# Patient Record
Sex: Male | Born: 1960
Health system: Southern US, Community
[De-identification: ages and names within clinical notes are randomized; demographics above are authoritative.]

## PROBLEM LIST (undated history)

## (undated) DIAGNOSIS — K219 Gastro-esophageal reflux disease without esophagitis: Secondary | ICD-10-CM

## (undated) DIAGNOSIS — I10 Essential (primary) hypertension: Secondary | ICD-10-CM

## (undated) DIAGNOSIS — S21119A Laceration without foreign body of unspecified front wall of thorax without penetration into thoracic cavity, initial encounter: Secondary | ICD-10-CM

## (undated) HISTORY — PX: CARDIAC SURGERY: SHX584

---

## 1998-09-14 ENCOUNTER — Emergency Department (HOSPITAL_COMMUNITY): Admission: EM | Admit: 1998-09-14 | Discharge: 1998-09-14 | Payer: Self-pay | Admitting: Emergency Medicine

## 2001-12-20 ENCOUNTER — Encounter: Payer: Self-pay | Admitting: Emergency Medicine

## 2001-12-20 ENCOUNTER — Emergency Department (HOSPITAL_COMMUNITY): Admission: EM | Admit: 2001-12-20 | Discharge: 2001-12-20 | Payer: Self-pay | Admitting: Emergency Medicine

## 2006-06-28 ENCOUNTER — Emergency Department (HOSPITAL_COMMUNITY): Admission: EM | Admit: 2006-06-28 | Discharge: 2006-06-28 | Payer: Self-pay | Admitting: Emergency Medicine

## 2006-08-04 ENCOUNTER — Ambulatory Visit (HOSPITAL_COMMUNITY): Admission: RE | Admit: 2006-08-04 | Discharge: 2006-08-04 | Payer: Self-pay | Admitting: General Surgery

## 2007-08-01 ENCOUNTER — Emergency Department (HOSPITAL_COMMUNITY): Admission: EM | Admit: 2007-08-01 | Discharge: 2007-08-01 | Payer: Self-pay | Admitting: Family Medicine

## 2008-03-20 ENCOUNTER — Emergency Department (HOSPITAL_COMMUNITY): Admission: EM | Admit: 2008-03-20 | Discharge: 2008-03-20 | Payer: Self-pay | Admitting: Emergency Medicine

## 2008-03-29 ENCOUNTER — Emergency Department (HOSPITAL_COMMUNITY): Admission: EM | Admit: 2008-03-29 | Discharge: 2008-03-29 | Payer: Self-pay | Admitting: Emergency Medicine

## 2010-12-07 NOTE — Op Note (Signed)
Larry Green, Larry Green              ACCOUNT NO.:  [id number]   MEDICAL RECORD NO.:  [id number]          PATIENT TYPE:  AMB   LOCATION:  SDS                          FACILITY:  [facility]   PHYSICIAN:  Gabrielle Dare. Janee Morn, M.D.DATE OF BIRTH:  06-Feb-1961   DATE OF PROCEDURE:  08/04/2006  DATE OF DISCHARGE:  08/04/2006                               OPERATIVE REPORT   PREOPERATIVE DIAGNOSIS:  Left inguinal hernia.   POSTOPERATIVE DIAGNOSIS:  Left inguinal hernia.   PROCEDURE:  Repair of left inguinal hernia with mesh.   SURGEON:  Gabrielle Dare. Janee Morn, M.D.   ANESTHESIA:  General.   HISTORY OF PRESENT ILLNESS:  Larry Green is a 50 year old African  American male whom I evaluated in the office for a symptomatic left  inguinal hernia who presents today for elective repair.   PROCEDURE IN DETAIL:  The patient was identified in the preop holding  area and an informed consent was obtained.  His site was marked.  He  received intravenous antibiotics.  He was brought to the operating room;  and general anesthesia with laryngeal mask airway was administered by  the anesthesia staff.  His abdomen and left groin were prepped and  draped in a sterile fashion.  Then 1/4% Marcaine with epinephrine was  injected for local anesthetic along the planned line of incision; and  out towards the anterior-superior iliac spine for some nerve block.  Left inguinal incision was made.  Subcutaneous tissues were dissected  down through Scarpa fascia excellent hemostasis was obtained with Bovie  cautery.  External oblique fascia was exposed.  This was divided  laterally and down through the external ring.  The superior leaflet of  the external oblique was dissected free off the transversalis; and the  inferior leaflet was dissected free and down away from cord structures,  revealing the shelving edge of inguinal ligament.  Ilioinguinal nerve  was protected; and swept out of the way.  Cord structures were encircled  with a Penrose drain.  Exploration of the floor revealed a direct  inguinal hernia which easily reduced; and the sac was not violated.  The  cord was then dissected.  There was a tiny cord lipoma, but no evidence  of an indirect hernia sac; this was clarified with careful dissection.   Once this was accomplished, the direct hernia sac was reduced; and the  hernia repair was accomplished with the keyhole mesh of polypropylene.  This was secured to the tissues over the pubic tubercle medially with 2-  0 Prolene suture.  It was then secured in a running fashion, inferiorly,  along the shelving edge of the inguinal ligament extending out  laterally.  The superior portion of mesh was then secured to the tissues  over the pubic tubercle with 2-0 Prolene suture; and it was tacked down  in an interrupted fashion with 2-0 Prolene sutures to the transversalis  musculature; and extending out laterally.  Two leaflets of the keyhole  mesh were rejoined behind the cord structures, reconstructing this ring.  These were tacked together and down to the underlying musculature with  several interrupted 2-0  Prolene sutures.  The area was copiously  irrigated.  Meticulous hemostasis was ensured.  The cord structures were  checked.  The aperture in the mesh was opened up, just slightly, to  allow the tip of the fifth digit to be admitted next to the cord  structures. The cord structures remained viable and non edematous.  Some  additional local anesthetic was injected.  Hemostasis was, again,  ensured and the external oblique fascia was then closed with a running 2-  0 Vicryl suture.  Subcutaneous tissues were irrigated, again.  Scarpa  fascia was approximated with interrupted 3-0 Vicryl sutures and the skin  was closed with a running 4-0 Monocryl subcuticular stitch.  Sponge,  needle, and instrument counts were correct.  Benzoin, Steri-Strips, and  sterile dressings were applied.  Please note that prior to  completion of  the fascial closure, some additional local anesthetic was injected.  Benzoin, Steri-Strips, and sterile dressings were applied.  The patient  tolerated the procedure well without apparent complication; and was  taken to the recovery room in stable condition.  Please note, that prior  to leaving the operating room his left testicle was relocated into  anatomic position in the scrotum.      Gabrielle Dare Janee Morn, M.D.  Electronically Signed     BET/MEDQ  D:  08/04/2006  T:  08/04/2006  Job:  [id number]

## 2011-01-13 ENCOUNTER — Emergency Department (HOSPITAL_COMMUNITY)
Admission: EM | Admit: 2011-01-13 | Discharge: 2011-01-13 | Disposition: A | Discharge status: Home / Self Care | Payer: Self-pay | Attending: Emergency Medicine | Admitting: Emergency Medicine

## 2011-01-13 DIAGNOSIS — M545 Low back pain, unspecified: Secondary | ICD-10-CM | POA: Insufficient documentation

## 2011-04-11 LAB — POCT RAPID STREP A: Streptococcus, Group A Screen (Direct): NEGATIVE

## 2011-06-10 ENCOUNTER — Emergency Department (HOSPITAL_COMMUNITY): Payer: Self-pay

## 2011-06-10 ENCOUNTER — Other Ambulatory Visit: Payer: Self-pay

## 2011-06-10 ENCOUNTER — Emergency Department (HOSPITAL_COMMUNITY)
Admission: EM | Admit: 2011-06-10 | Discharge: 2011-06-10 | Disposition: A | Discharge status: Home / Self Care | Payer: Self-pay | Attending: Emergency Medicine | Admitting: Emergency Medicine

## 2011-06-10 ENCOUNTER — Encounter: Payer: Self-pay | Admitting: *Deleted

## 2011-06-10 DIAGNOSIS — R209 Unspecified disturbances of skin sensation: Secondary | ICD-10-CM | POA: Insufficient documentation

## 2011-06-10 DIAGNOSIS — R200 Anesthesia of skin: Secondary | ICD-10-CM

## 2011-06-10 DIAGNOSIS — Z7982 Long term (current) use of aspirin: Secondary | ICD-10-CM | POA: Insufficient documentation

## 2011-06-10 LAB — CBC
Hemoglobin: 15.9 g/dL (ref 13.0–17.0)
RBC: 4.95 MIL/uL (ref 4.22–5.81)

## 2011-06-10 LAB — RAPID URINE DRUG SCREEN, HOSP PERFORMED
Amphetamines: NOT DETECTED
Barbiturates: NOT DETECTED
Benzodiazepines: NOT DETECTED
Cocaine: NOT DETECTED
Tetrahydrocannabinol: POSITIVE — AB

## 2011-06-10 LAB — POCT I-STAT, CHEM 8
BUN: 13 mg/dL (ref 6–23)
Calcium, Ion: 1.15 mmol/L (ref 1.12–1.32)
TCO2: 26 mmol/L (ref 0–100)

## 2011-06-10 LAB — DIFFERENTIAL
Lymphocytes Relative: 37 % (ref 12–46)
Lymphs Abs: 2.3 10*3/uL (ref 0.7–4.0)
Monocytes Relative: 9 % (ref 3–12)
Neutro Abs: 3.2 10*3/uL (ref 1.7–7.7)
Neutrophils Relative %: 52 % (ref 43–77)

## 2011-06-10 NOTE — ED Notes (Signed)
Patient stated one week ago bee sting posterior neck since then when patient stands up for 30 seconds right side numbness from head to toe resolves then returned when patient lays down and stands up.  Ax4 family at bedside resting comfortably on stretcher.

## 2011-06-10 NOTE — ED Notes (Signed)
Patient states he is having right sided numbness

## 2011-06-10 NOTE — ED Provider Notes (Signed)
History     CSN: [id number] Arrival date & time: 06/10/2011  7:33 AM   First MD Initiated Contact with Patient 06/10/11 [phone]      Chief Complaint  Patient presents with  . Numbness    (Consider location/radiation/quality/duration/timing/severity/associated sxs/prior treatment) The history is provided by the patient.   patient states that for the last week he's had right-sided numbness. He states it feels as if he had had a shot like at the dentist . He states he comes on when he stands up. He said it starts in his right foot at work his way up to his right face. Involves his right arm, but does not involve his chest and abdomen. There is no weakness with it just the numbness. He states it lasts about 40 seconds if he lays back down. It lasts longer feels way down. He states it happens every time he stands up. No headaches. He's not had problems like this before the last week. He smokes. He does not have a history of stroke. No chest pain. No nausea vomiting or diarrhea. No difficulty speaking. No difficulty seen.  History reviewed. No pertinent past medical history.  History reviewed. No pertinent past surgical history.  History reviewed. No pertinent family history.  History  Substance Use Topics  . Smoking status: Current Everyday Smoker  . Smokeless tobacco: Not on file  . Alcohol Use: Yes      Review of Systems  Constitutional: Negative for activity change and appetite change.  HENT: Negative for neck stiffness.   Eyes: Negative for pain.  Respiratory: Negative for chest tightness and shortness of breath.   Cardiovascular: Negative for chest pain and leg swelling.  Gastrointestinal: Negative for nausea, vomiting, abdominal pain and diarrhea.  Genitourinary: Negative for flank pain.  Musculoskeletal: Negative for back pain.  Skin: Negative for rash.  Neurological: Positive for numbness. Negative for weakness and headaches.  Psychiatric/Behavioral: Negative for  behavioral problems.    Allergies  Review of patient's allergies indicates no known allergies.  Home Medications   Current Outpatient Rx  Name Route Sig Dispense Refill  . ASPIRIN 81 MG PO CHEW Oral Chew 81 mg by mouth daily.        BP 144/86  Pulse 67  Temp(Src) 97.9 F (36.6 C) (Oral)  SpO2 98%  Physical Exam  Nursing note and vitals reviewed. Constitutional: He is oriented to person, place, and time. He appears well-developed and well-nourished.  HENT:  Head: Normocephalic and atraumatic.  Eyes: EOM are normal. Pupils are equal, round, and reactive to light.  Neck: Normal range of motion. Neck supple.  Cardiovascular: Normal rate, regular rhythm and normal heart sounds.   No murmur heard. Pulmonary/Chest: Effort normal and breath sounds normal.  Abdominal: Soft. Bowel sounds are normal. He exhibits no distension and no mass. There is no tenderness. There is no rebound and no guarding.  Musculoskeletal: Normal range of motion. He exhibits no edema.  Neurological: He is alert and oriented to person, place, and time. He displays normal reflexes. No cranial nerve deficit. He exhibits normal muscle tone. Coordination normal.       Mildly decreased sensation over arch and dorsum of right foot. Strength intact. Patient was then stood up and states that he then had decreased sensation of her face right arm and right lower extremity. Strength was grossly intact. Sensation mildly decreased.  Skin: Skin is warm and dry.  Psychiatric: He has a normal mood and affect.    ED Course  Procedures (including critical care time)  Labs Reviewed  URINE RAPID DRUG SCREEN (HOSP PERFORMED) - Abnormal; Notable for the following:    Tetrahydrocannabinol POSITIVE (*)    All other components within normal limits  POCT I-STAT, CHEM 8 - Abnormal; Notable for the following:    Glucose, Bld 113 (*)    All other components within normal limits  CBC  DIFFERENTIAL  I-STAT, CHEM 8   Dg Chest 2  View  06/10/2011  *RADIOLOGY REPORT*  Clinical Data: Right-sided weakness, dizziness, and numbness.  CHEST - 2 VIEW  Comparison: 08/01/2006  Findings: The heart size and vascularity are normal.  No acute infiltrates or effusions.  Evidence of previous sternotomy. Chronic blunting of the left costophrenic angle.  IMPRESSION: No acute disease in the chest.  Original Report Authenticated By: Gwynn Burly, M.D.   Ct Head Wo Contrast  06/10/2011  *RADIOLOGY REPORT*  Clinical Data: Right sided numbness.  CT HEAD WITHOUT CONTRAST  Technique:  Contiguous axial images were obtained from the base of the skull through the vertex without contrast.  Comparison: Head CT 03/29/2008.  Findings: There is no evidence of acute intracranial hemorrhage, mass lesion, brain edema or extra-axial fluid collection.  The ventricles and subarachnoid spaces are appropriately sized for age. There is no CT evidence of acute cortical infarction.  The visualized paranasal sinuses are clear. The calvarium is intact. No skull base abnormalities are demonstrated.  IMPRESSION: Stable examination.  No acute intracranial findings.  Original Report Authenticated By: Gerrianne Scale, M.D.     1. Numbness      Date: 06/10/2011  Rate: 56  Rhythm: sinus bradycardia  QRS Axis: normal  Intervals: normal  ST/T Wave abnormalities: normal  Conduction Disutrbances:none  Narrative Interpretation:   Old EKG Reviewed: unchanged    MDM  Patient has had episodes of right-sided numbness without weakness. He states that his right face right upper extremity right lower extremity. Does not involve his chest or abdomen. It is worse when he stands up and goes away when he lays down. His EKG is reassuring. His head CT does not show a cause. I discussed case with Dr. Thad Ranger from neurology. She suggested that he follow up as an outpatient with neurology. He will be discharged.        Juliet Rude. Rubin Payor, MD 06/10/11 1017
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
## 2011-07-10 DIAGNOSIS — W260XXA Contact with knife, initial encounter: Secondary | ICD-10-CM | POA: Insufficient documentation

## 2011-07-10 DIAGNOSIS — F17200 Nicotine dependence, unspecified, uncomplicated: Secondary | ICD-10-CM | POA: Insufficient documentation

## 2011-07-10 DIAGNOSIS — W261XXA Contact with sword or dagger, initial encounter: Secondary | ICD-10-CM | POA: Insufficient documentation

## 2011-07-10 DIAGNOSIS — S61209A Unspecified open wound of unspecified finger without damage to nail, initial encounter: Secondary | ICD-10-CM | POA: Insufficient documentation

## 2011-07-10 DIAGNOSIS — M79609 Pain in unspecified limb: Secondary | ICD-10-CM | POA: Insufficient documentation

## 2011-07-11 ENCOUNTER — Emergency Department (HOSPITAL_COMMUNITY)
Admission: EM | Admit: 2011-07-11 | Discharge: 2011-07-11 | Disposition: A | Discharge status: Home / Self Care | Payer: Self-pay | Attending: Emergency Medicine | Admitting: Emergency Medicine

## 2011-07-11 ENCOUNTER — Encounter (HOSPITAL_COMMUNITY): Payer: Self-pay | Admitting: Emergency Medicine

## 2011-07-11 ENCOUNTER — Emergency Department (HOSPITAL_COMMUNITY): Payer: Self-pay

## 2011-07-11 DIAGNOSIS — T148XXA Other injury of unspecified body region, initial encounter: Secondary | ICD-10-CM

## 2011-07-11 MED ORDER — "THROMBI-PAD 3""X3"" EX PADS"
MEDICATED_PAD | CUTANEOUS | Status: AC
  Administered 2011-07-11: 03:00:00
  Filled 2011-07-11: qty 1

## 2011-07-11 MED ORDER — OXYCODONE-ACETAMINOPHEN 5-325 MG PO TABS: 1.0000 | ORAL_TABLET | Freq: Four times a day (QID) | ORAL | Status: AC | PRN

## 2011-07-11 MED ORDER — TETANUS-DIPHTH-ACELL PERTUSSIS 5-2.5-18.5 LF-MCG/0.5 IM SUSP
0.5000 mL | Freq: Once | INTRAMUSCULAR | Status: AC
  Administered 2011-07-11: 0.5 mL via INTRAMUSCULAR
  Filled 2011-07-11: qty 0.5

## 2011-07-11 MED ORDER — FENTANYL CITRATE 0.05 MG/ML IJ SOLN
50.0000 ug | Freq: Once | INTRAMUSCULAR | Status: AC
  Administered 2011-07-11: 50 ug via INTRAMUSCULAR
  Filled 2011-07-11: qty 2

## 2011-07-11 NOTE — ED Notes (Signed)
Pt. Discharged to home with friend

## 2011-07-11 NOTE — ED Notes (Signed)
Pt ambulated to radiology, no active bleeding from left hand laceration. Area has dressing intact

## 2011-07-11 NOTE — ED Notes (Signed)
Report given to David, RN.

## 2011-07-11 NOTE — ED Provider Notes (Addendum)
History     CSN: [id number] Arrival date & time: 07/11/2011 12:10 AM   First MD Initiated Contact with Patient 07/11/11 [phone]      Chief Complaint  Patient presents with  . Laceration    (Consider location/radiation/quality/duration/timing/severity/associated sxs/prior treatment) Patient is a 50 y.o. male presenting with skin laceration. The history is provided by the patient. No language interpreter was used.  Laceration  The incident occurred 3 to 5 hours ago. The laceration is located on the left hand. The laceration is 3 cm (3.5 cm) in size. Injury mechanism: box cutter. The pain is at a severity of 9/10. The pain is severe. The pain has been constant since onset. He reports no foreign bodies present. His tetanus status is out of date.  Entire dorsum of the left thum is numb.  Patient has decreased ROM of the left thumb   History reviewed. No pertinent past medical history.  History reviewed. No pertinent past surgical history.  No family history on file.  History  Substance Use Topics  . Smoking status: Current Everyday Smoker  . Smokeless tobacco: Not on file  . Alcohol Use: Yes      Review of Systems  HENT: Negative for facial swelling.   Eyes: Negative for discharge.  Respiratory: Negative for apnea.   Cardiovascular: Negative for chest pain.  Gastrointestinal: Negative for abdominal distention.  Genitourinary: Negative for difficulty urinating.  Musculoskeletal: Negative for myalgias and arthralgias.  Neurological: Negative for dizziness.  Hematological: Negative.   Psychiatric/Behavioral: Negative.     Allergies  Review of patient's allergies indicates no known allergies.  Home Medications   Current Outpatient Rx  Name Route Sig Dispense Refill  . IBUPROFEN 200 MG PO TABS Oral Take 400 mg by mouth at bedtime as needed. For pain       BP 139/92  Pulse 66  Temp(Src) 98.3 F (36.8 C) (Oral)  Resp 16  SpO2 97%  Physical Exam  Constitutional: He  is oriented to person, place, and time. He appears well-developed and well-nourished.  HENT:  Head: Normocephalic and atraumatic.  Mouth/Throat: Oropharynx is clear and moist.  Eyes: EOM are normal. Pupils are equal, round, and reactive to light.  Neck: Normal range of motion. Neck supple.  Cardiovascular: Normal rate and regular rhythm.   Pulmonary/Chest: Effort normal and breath sounds normal.  Abdominal: Soft. Bowel sounds are normal.  Musculoskeletal:       Hands:      Cap refill < 2 sec  Neurological: He is alert and oriented to person, place, and time. He has normal reflexes.  Skin: Skin is warm and dry.  Psychiatric: Thought content normal.    ED Course  LACERATION REPAIR Date/Time: 07/11/2011 4:05 AM Performed by: Jasmine Awe Authorized by: Jasmine Awe Consent: Verbal consent obtained. Consent given by: patient Patient understanding: patient states understanding of the procedure being performed Patient identity confirmed: arm band and verbally with patient Body area: upper extremity Laceration length: 3.5 cm Foreign bodies: no foreign bodies Tendon involvement: superficial Nerve involvement: complex Vascular damage: no Anesthesia: local infiltration Local anesthetic: lidocaine 1% without epinephrine Anesthetic total: 3 ml Patient sedated: no Preparation: Patient was prepped and draped in the usual sterile fashion. Irrigation solution: saline Irrigation method: syringe Amount of cleaning: extensive Debridement: none Degree of undermining: extensive Skin closure: 4-0 nylon and Ethilon Number of sutures: 4 Technique: simple Approximation: loose Approximation difficulty: simple Dressing: 4x4 sterile gauze Patient tolerance: Patient tolerated the procedure well with no immediate  complications.   (including critical care time)  Labs Reviewed - No data to display Dg Hand Complete Left  07/11/2011  *RADIOLOGY REPORT*  Clinical Data:  Laceration to the first digit.  Evaluate for foreign body.  LEFT HAND - COMPLETE 3+ VIEW  Comparison: None.  Findings: Laceration projects over the first metacarpal.  No associated radiopaque foreign body. There is a linear sclerotic focus along the cortex of the radial aspect first metacarpal. Conceivably, this may be post traumatic.  Otherwise, no fracture or dislocation.  IMPRESSION: No radiopaque foreign body.  Irregularity along the radial aspect of the first metacarpal, underlies the soft tissue injury.  Conceivably, this could be post- traumatic. Correlate with the mechanism of injury.  Recommend a follow-up radiograph in 2-3 weeks to document interval healing or stability.  Original Report Authenticated By: Waneta Martins, M.D.     No diagnosis found.    MDM  345 am case d/w Dr. Lajoyce Corners of hand.  Loosely suture for hemostasis and have patient call office at 830 for same day appointment for complete repair by Dr. Lajoyce Corners Patient and brother informed that patient must call Dr. Lajoyce Corners and schedule same day appointment for repair of injury.  Patient and brother both verbalize understanding and agree to follow up today.          Jasmine Awe, MD 07/11/11 0405  Kaityln Kallstrom K Angelle Isais-Rasch, MD 07/11/11 [phone]

## 2011-07-11 NOTE — ED Notes (Signed)
Pt st's he cut base of left thumb with utility knife.  Bleeding controlled at this time

## 2012-05-23 ENCOUNTER — Emergency Department (HOSPITAL_COMMUNITY)
Admission: EM | Admit: 2012-05-23 | Discharge: 2012-05-23 | Disposition: A | Discharge status: Home / Self Care | Payer: Self-pay | Attending: Emergency Medicine | Admitting: Emergency Medicine

## 2012-05-23 ENCOUNTER — Encounter (HOSPITAL_COMMUNITY): Payer: Self-pay | Admitting: *Deleted

## 2012-05-23 ENCOUNTER — Emergency Department (HOSPITAL_COMMUNITY): Payer: Self-pay

## 2012-05-23 DIAGNOSIS — M1710 Unilateral primary osteoarthritis, unspecified knee: Secondary | ICD-10-CM | POA: Insufficient documentation

## 2012-05-23 DIAGNOSIS — M1990 Unspecified osteoarthritis, unspecified site: Secondary | ICD-10-CM

## 2012-05-23 DIAGNOSIS — F17200 Nicotine dependence, unspecified, uncomplicated: Secondary | ICD-10-CM | POA: Insufficient documentation

## 2012-05-23 DIAGNOSIS — IMO0002 Reserved for concepts with insufficient information to code with codable children: Secondary | ICD-10-CM | POA: Insufficient documentation

## 2012-05-23 MED ORDER — HYDROCODONE-ACETAMINOPHEN 5-325 MG PO TABS: 2.0000 | ORAL_TABLET | ORAL | Status: DC | PRN

## 2012-05-23 MED ORDER — IBUPROFEN 600 MG PO TABS: 600.0000 mg | ORAL_TABLET | Freq: Four times a day (QID) | ORAL | Status: DC | PRN

## 2012-05-23 NOTE — ED Notes (Signed)
PT is here with chronic right knee pain and now with swelling

## 2012-05-23 NOTE — ED Notes (Signed)
Pt transported back from xray 
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
## 2013-01-05 IMAGING — CT CT HEAD W/O CM
1 series · 16 of 30 positions shown, 20 images · non-contrast
Comparison: Head CT 03/29/2008.

CLINICAL DATA: Right sided numbness.

CT HEAD WITHOUT CONTRAST
TECHNIQUE: Contiguous axial images were obtained from the base of
the skull through the vertex without contrast.

[Series 2: head routine 4.8 h37s · axial · 0.45mm/px · z∈[-107,+26]mm · 16 of 30 slices shown, 20 images]
[im 2/30  brain]
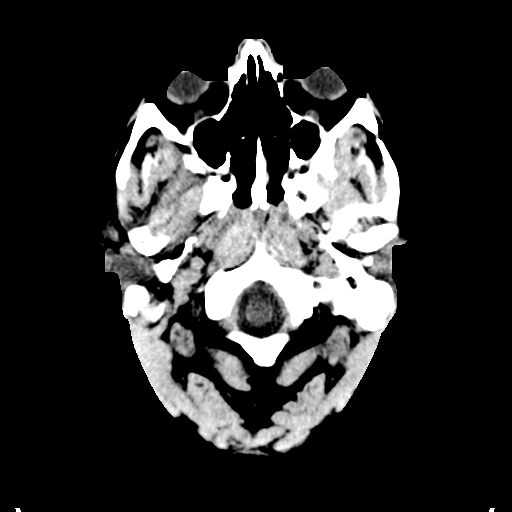
[im 2/30  bone]
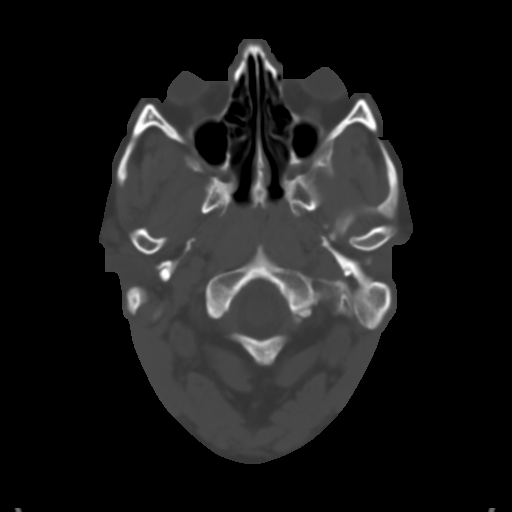
[im 4/30  brain]
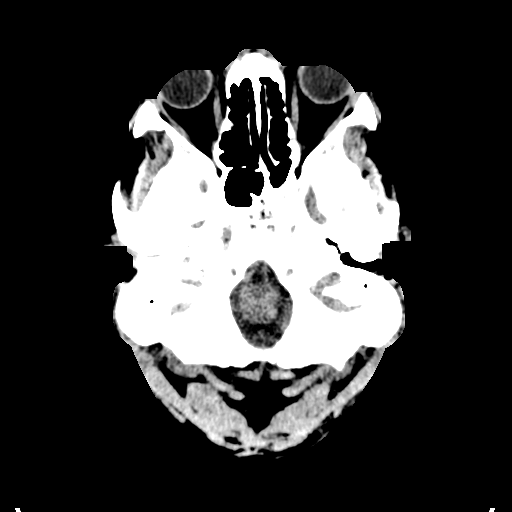
[im 6/30  brain]
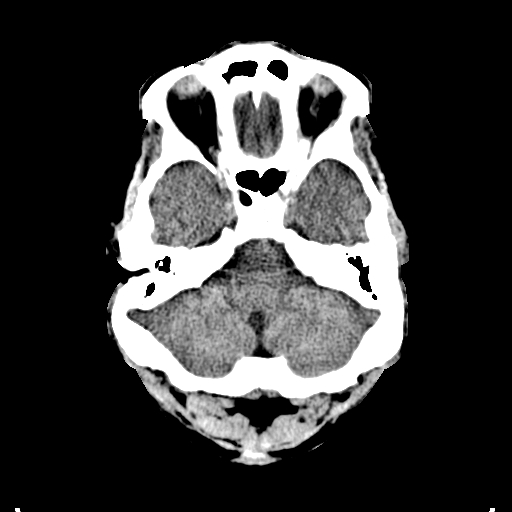
[im 8/30  brain]
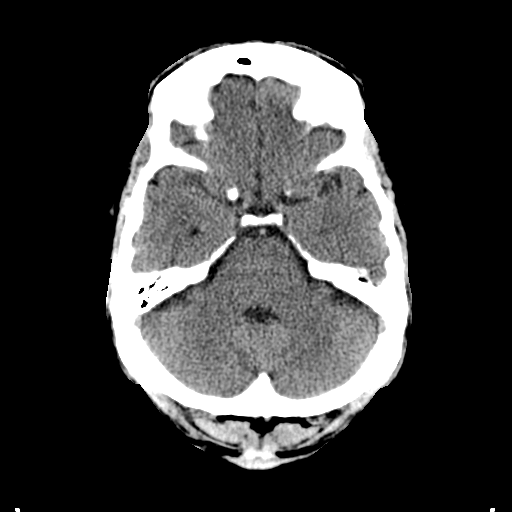
[im 9/30  brain]
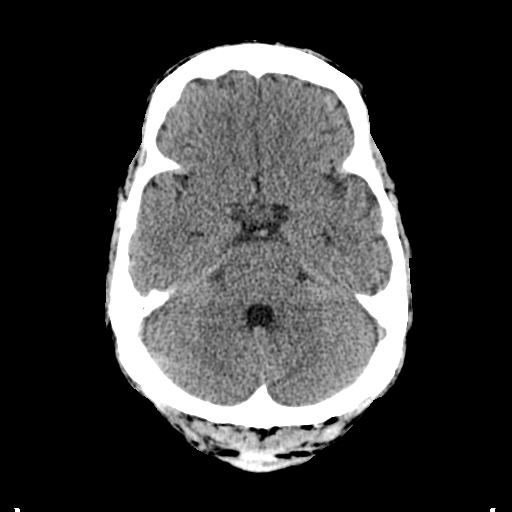
[im 9/30  bone]
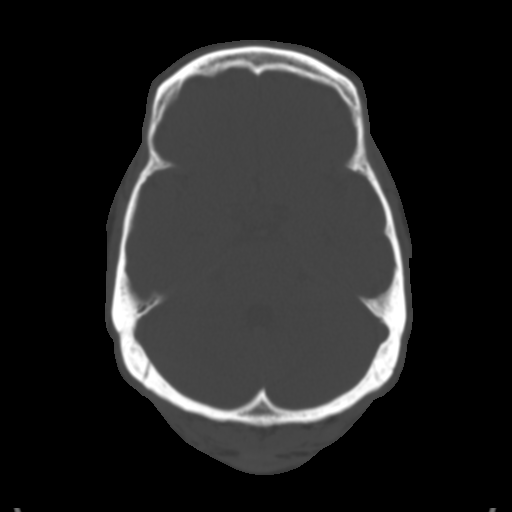
[im 11/30  brain]
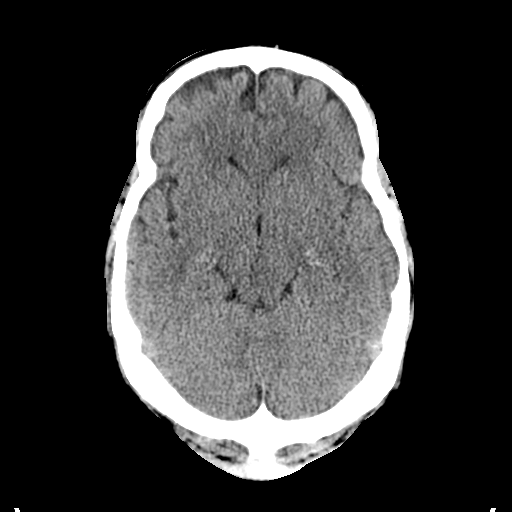
[im 13/30  brain]
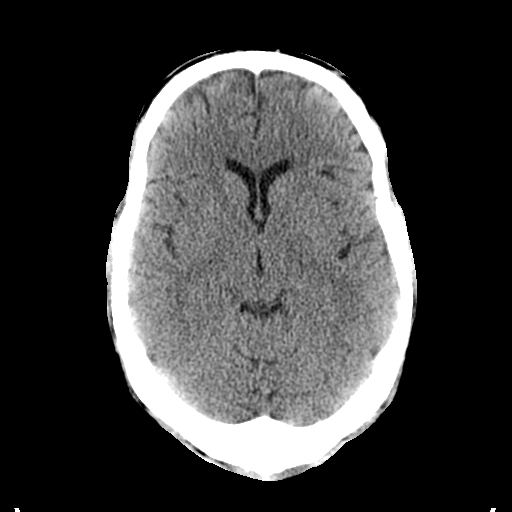
[im 15/30  brain]
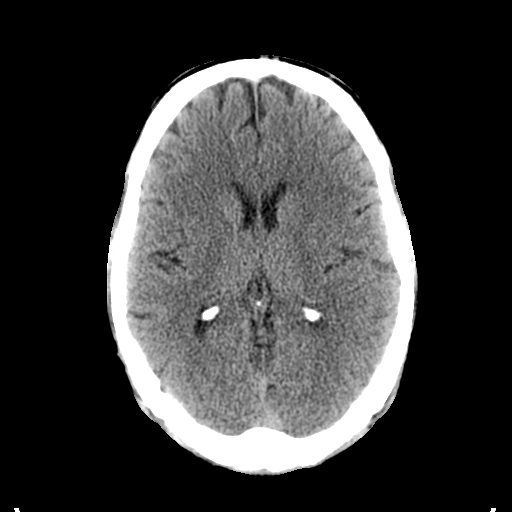
[im 16/30  brain]
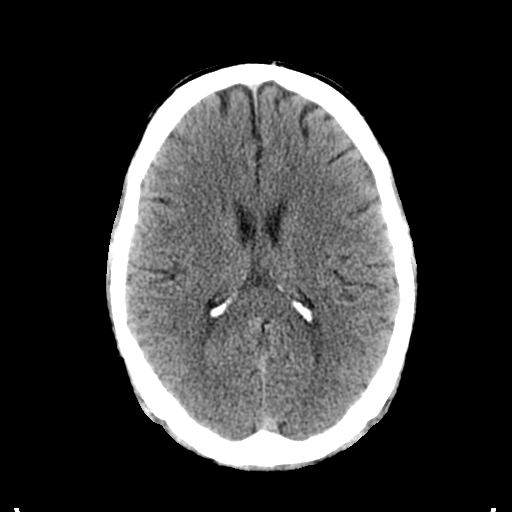
[im 16/30  bone]
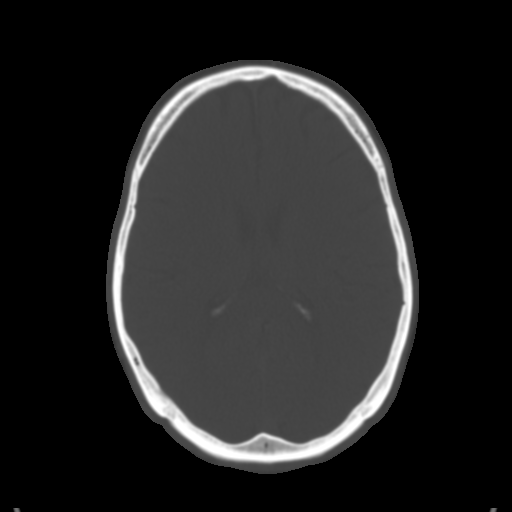
[im 18/30  brain]
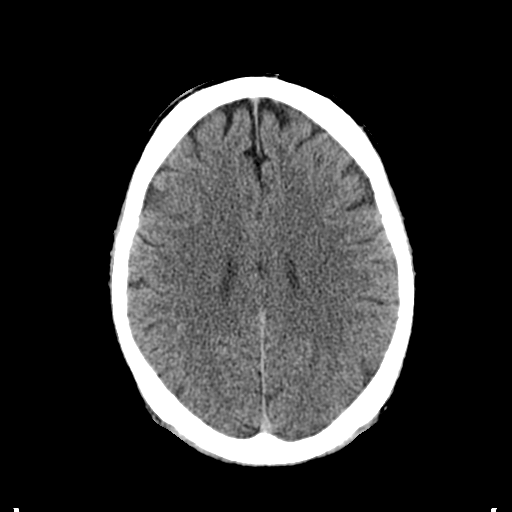
[im 20/30  brain]
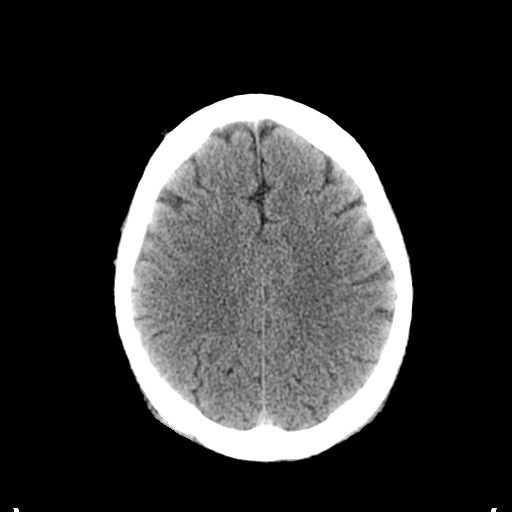
[im 22/30  brain]
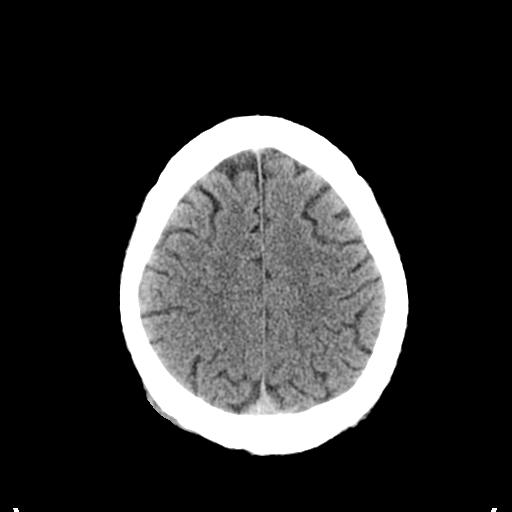
[im 23/30  brain]
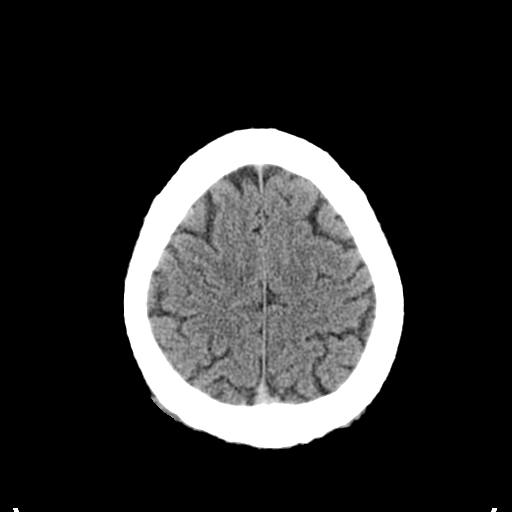
[im 23/30  bone]
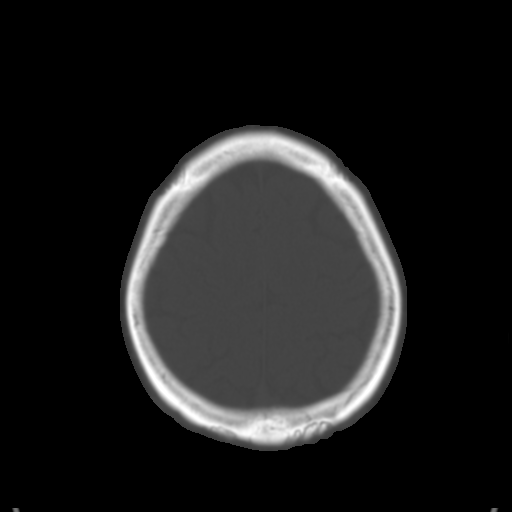
[im 25/30  brain]
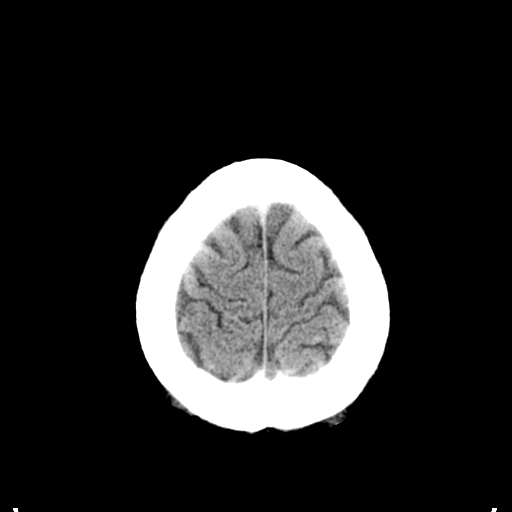
[im 27/30  brain]
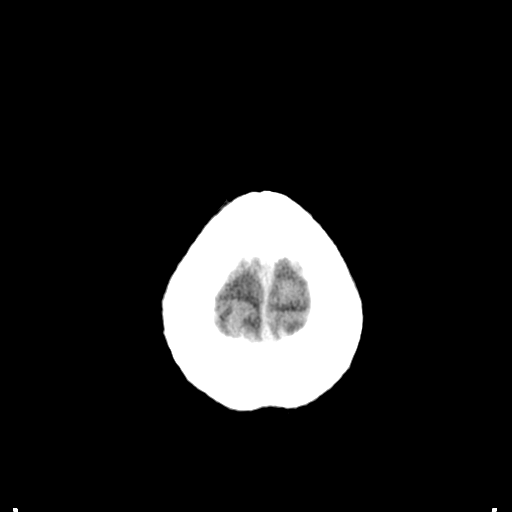
[im 29/30  brain]
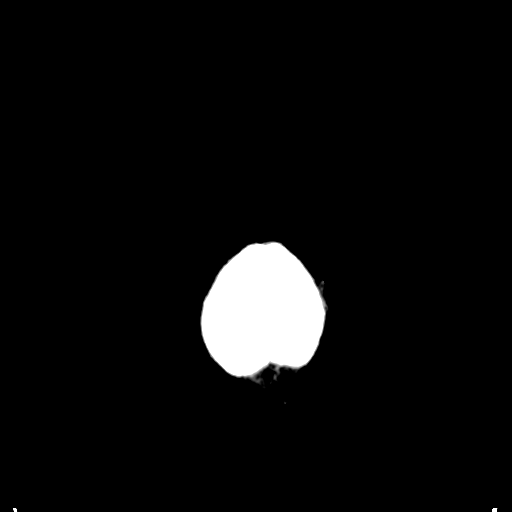

[16 of 30 positions shown; findings below may reference images not displayed]

FINDINGS: There is no evidence of acute intracranial hemorrhage,
mass lesion, brain edema or extra-axial fluid collection.  The
ventricles and subarachnoid spaces are appropriately sized for age.
There is no CT evidence of acute cortical infarction.

The visualized paranasal sinuses are clear. The calvarium is
intact. No skull base abnormalities are demonstrated.
IMPRESSION: Stable examination.  No acute intracranial findings.

## 2013-02-03 ENCOUNTER — Ambulatory Visit (HOSPITAL_COMMUNITY)
Admission: RE | Admit: 2013-02-03 | Discharge: 2013-02-03 | Disposition: A | Discharge status: Home / Self Care | Payer: 59 | Source: Ambulatory Visit | Attending: Nurse Practitioner | Admitting: Nurse Practitioner

## 2013-02-03 ENCOUNTER — Other Ambulatory Visit (HOSPITAL_COMMUNITY): Payer: Self-pay | Admitting: Nurse Practitioner

## 2013-02-03 DIAGNOSIS — E0789 Other specified disorders of thyroid: Secondary | ICD-10-CM | POA: Insufficient documentation

## 2013-02-03 DIAGNOSIS — R220 Localized swelling, mass and lump, head: Secondary | ICD-10-CM

## 2013-02-03 DIAGNOSIS — R221 Localized swelling, mass and lump, neck: Secondary | ICD-10-CM

## 2013-02-10 ENCOUNTER — Ambulatory Visit (HOSPITAL_COMMUNITY): Payer: 59

## 2013-02-10 ENCOUNTER — Other Ambulatory Visit (HOSPITAL_COMMUNITY): Payer: Self-pay | Admitting: Internal Medicine

## 2013-02-10 DIAGNOSIS — M542 Cervicalgia: Secondary | ICD-10-CM

## 2014-02-03 ENCOUNTER — Encounter (HOSPITAL_COMMUNITY): Payer: Self-pay | Admitting: Emergency Medicine

## 2014-02-03 ENCOUNTER — Emergency Department (HOSPITAL_COMMUNITY): Payer: Self-pay

## 2014-02-03 ENCOUNTER — Emergency Department (HOSPITAL_COMMUNITY)
Admission: EM | Admit: 2014-02-03 | Discharge: 2014-02-03 | Disposition: A | Discharge status: Home / Self Care | Payer: Self-pay | Attending: Emergency Medicine | Admitting: Emergency Medicine

## 2014-02-03 ENCOUNTER — Emergency Department (HOSPITAL_COMMUNITY): Payer: 59

## 2014-02-03 DIAGNOSIS — F17200 Nicotine dependence, unspecified, uncomplicated: Secondary | ICD-10-CM | POA: Insufficient documentation

## 2014-02-03 DIAGNOSIS — J90 Pleural effusion, not elsewhere classified: Secondary | ICD-10-CM

## 2014-02-03 DIAGNOSIS — Z87828 Personal history of other (healed) physical injury and trauma: Secondary | ICD-10-CM | POA: Insufficient documentation

## 2014-02-03 DIAGNOSIS — K219 Gastro-esophageal reflux disease without esophagitis: Secondary | ICD-10-CM | POA: Insufficient documentation

## 2014-02-03 DIAGNOSIS — R7402 Elevation of levels of lactic acid dehydrogenase (LDH): Secondary | ICD-10-CM | POA: Insufficient documentation

## 2014-02-03 DIAGNOSIS — Z79899 Other long term (current) drug therapy: Secondary | ICD-10-CM | POA: Insufficient documentation

## 2014-02-03 DIAGNOSIS — I10 Essential (primary) hypertension: Secondary | ICD-10-CM | POA: Insufficient documentation

## 2014-02-03 DIAGNOSIS — R911 Solitary pulmonary nodule: Secondary | ICD-10-CM

## 2014-02-03 DIAGNOSIS — IMO0001 Reserved for inherently not codable concepts without codable children: Secondary | ICD-10-CM

## 2014-02-03 DIAGNOSIS — R740 Nonspecific elevation of levels of transaminase and lactic acid dehydrogenase [LDH]: Secondary | ICD-10-CM

## 2014-02-03 DIAGNOSIS — Z792 Long term (current) use of antibiotics: Secondary | ICD-10-CM | POA: Insufficient documentation

## 2014-02-03 DIAGNOSIS — R079 Chest pain, unspecified: Secondary | ICD-10-CM | POA: Insufficient documentation

## 2014-02-03 DIAGNOSIS — R7401 Elevation of levels of liver transaminase levels: Secondary | ICD-10-CM

## 2014-02-03 HISTORY — DX: Essential (primary) hypertension: I10

## 2014-02-03 HISTORY — DX: Gastro-esophageal reflux disease without esophagitis: K21.9

## 2014-02-03 HISTORY — DX: Laceration without foreign body of unspecified front wall of thorax without penetration into thoracic cavity, initial encounter: S21.119A

## 2014-02-03 LAB — CBC WITH DIFFERENTIAL/PLATELET
BASOS PCT: 1 % (ref 0–1)
Basophils Absolute: 0.1 10*3/uL (ref 0.0–0.1)
EOS ABS: 0.2 10*3/uL (ref 0.0–0.7)
EOS PCT: 2 % (ref 0–5)
HEMATOCRIT: 46.9 % (ref 39.0–52.0)
HEMOGLOBIN: 16.1 g/dL (ref 13.0–17.0)
LYMPHS PCT: 27 % (ref 12–46)
Lymphs Abs: 2.4 10*3/uL (ref 0.7–4.0)
MCH: 31.8 pg (ref 26.0–34.0)
MCHC: 34.3 g/dL (ref 30.0–36.0)
MCV: 92.7 fL (ref 78.0–100.0)
Monocytes Absolute: 1.2 10*3/uL — ABNORMAL HIGH (ref 0.1–1.0)
Monocytes Relative: 13 % — ABNORMAL HIGH (ref 3–12)
NEUTROS ABS: 5 10*3/uL (ref 1.7–7.7)
NEUTROS PCT: 57 % (ref 43–77)
Platelets: 304 10*3/uL (ref 150–400)
RBC: 5.06 MIL/uL (ref 4.22–5.81)
RDW: 12.8 % (ref 11.5–15.5)
WBC: 8.9 10*3/uL (ref 4.0–10.5)

## 2014-02-03 LAB — COMPREHENSIVE METABOLIC PANEL
ALT: 120 U/L — ABNORMAL HIGH (ref 0–53)
ANION GAP: 15 (ref 5–15)
AST: 83 U/L — ABNORMAL HIGH (ref 0–37)
Albumin: 3.9 g/dL (ref 3.5–5.2)
Alkaline Phosphatase: 169 U/L — ABNORMAL HIGH (ref 39–117)
BUN: 11 mg/dL (ref 6–23)
CALCIUM: 9.8 mg/dL (ref 8.4–10.5)
CO2: 28 mEq/L (ref 19–32)
Chloride: 97 mEq/L (ref 96–112)
Creatinine, Ser: 1.07 mg/dL (ref 0.50–1.35)
GFR calc non Af Amer: 76 mL/min — ABNORMAL LOW (ref >90)
GFR, EST AFRICAN AMERICAN: 88 mL/min — AB (ref >90)
Glucose, Bld: 103 mg/dL — ABNORMAL HIGH (ref 70–99)
Potassium: 4.7 mEq/L (ref 3.7–5.3)
Sodium: 140 mEq/L (ref 137–147)
TOTAL PROTEIN: 8.2 g/dL (ref 6.0–8.3)
Total Bilirubin: 0.4 mg/dL (ref 0.3–1.2)

## 2014-02-03 LAB — I-STAT TROPONIN, ED: TROPONIN I, POC: 0 ng/mL (ref 0.00–0.08)

## 2014-02-03 LAB — URINALYSIS, ROUTINE W REFLEX MICROSCOPIC
Glucose, UA: NEGATIVE mg/dL
Hgb urine dipstick: NEGATIVE
Ketones, ur: 15 mg/dL — AB
LEUKOCYTES UA: NEGATIVE
NITRITE: NEGATIVE
PROTEIN: NEGATIVE mg/dL
Specific Gravity, Urine: 1.026 (ref 1.005–1.030)
Urobilinogen, UA: 1 mg/dL (ref 0.0–1.0)
pH: 5.5 (ref 5.0–8.0)

## 2014-02-03 LAB — D-DIMER, QUANTITATIVE: D-Dimer, Quant: 0.72 ug/mL-FEU — ABNORMAL HIGH (ref 0.00–0.48)

## 2014-02-03 LAB — LIPASE, BLOOD: LIPASE: 48 U/L (ref 11–59)

## 2014-02-03 MED ORDER — LEVOFLOXACIN 500 MG PO TABS
500.0000 mg | ORAL_TABLET | Freq: Every day | ORAL | Status: DC
Start: 2014-02-03 — End: 2017-05-30

## 2014-02-03 MED ORDER — ONDANSETRON HCL 4 MG/2ML IJ SOLN
4.0000 mg | Freq: Once | INTRAMUSCULAR | Status: AC
  Administered 2014-02-03: 4 mg via INTRAVENOUS
  Filled 2014-02-03: qty 2

## 2014-02-03 MED ORDER — MORPHINE SULFATE 4 MG/ML IJ SOLN
4.0000 mg | Freq: Once | INTRAMUSCULAR | Status: AC
  Administered 2014-02-03: 4 mg via INTRAVENOUS
  Filled 2014-02-03: qty 1

## 2014-02-03 MED ORDER — OMEPRAZOLE 20 MG PO CPDR: 20.0000 mg | DELAYED_RELEASE_CAPSULE | Freq: Every day | ORAL | Status: DC

## 2014-02-03 MED ORDER — PANTOPRAZOLE SODIUM 40 MG IV SOLR
40.0000 mg | Freq: Once | INTRAVENOUS | Status: AC
  Administered 2014-02-03: 40 mg via INTRAVENOUS
  Filled 2014-02-03: qty 40

## 2014-02-03 MED ORDER — GI COCKTAIL ~~LOC~~
30.0000 mL | Freq: Once | ORAL | Status: AC
  Administered 2014-02-03: 30 mL via ORAL
  Filled 2014-02-03: qty 30

## 2014-02-03 MED ORDER — SODIUM CHLORIDE 0.9 % IV BOLUS (SEPSIS)
1000.0000 mL | Freq: Once | INTRAVENOUS | Status: AC
  Administered 2014-02-03: 1000 mL via INTRAVENOUS

## 2014-02-03 MED ORDER — IOHEXOL 350 MG/ML SOLN
100.0000 mL | Freq: Once | INTRAVENOUS | Status: AC | PRN
  Administered 2014-02-03: 100 mL via INTRAVENOUS

## 2014-02-03 MED ORDER — HYDROCODONE-ACETAMINOPHEN 5-325 MG PO TABS: 1.0000 | ORAL_TABLET | Freq: Four times a day (QID) | ORAL | Status: DC | PRN

## 2014-02-03 NOTE — ED Notes (Signed)
Pt reporting pain with inspiration to the right side.  Pt sts pain is worse in back.  Denies any other s/s; no SOB, no chest pain, no nv, no diaphoresis.
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
## 2014-02-03 NOTE — Discharge Instructions (Signed)
Pulmonary Nodule You need repeat CT in 3 months.  A pulmonary nodule is a small, round growth of tissue in the lung. Pulmonary nodules can range in size from less than 1/5 inch (4 mm) to a little bigger than an inch (25 mm). Most pulmonary nodules are detected when imaging tests of the lung are being performed for a different problem. Pulmonary nodules are usually not cancerous (benign). However, some pulmonary nodules are cancerous (malignant). Follow-up treatment or testing is based on the size of the pulmonary nodule and your risk of getting lung cancer.  CAUSES Benign pulmonary nodules can be caused by various things. Some of the causes include:   Bacterial, fungal, or viral infections. This is usually an old infection that is no longer active, but it can sometimes be a current, active infection.  A benign mass of tissue.  Inflammation from conditions such as rheumatoid arthritis.   Abnormal blood vessels in the lungs. Malignant pulmonary nodules can result from lung cancer or from cancers that spread to the lung from other places in the body. SIGNS AND SYMPTOMS Pulmonary nodules usually do not cause symptoms. DIAGNOSIS Most often, pulmonary nodules are found incidentally when an X-ray or CT scan is performed to look for some other problem in the lung area. To help determine whether a pulmonary nodule is benign or malignant, your health care provider will take a medical history and order a variety of tests. Tests done may include:   Blood tests.  A skin test called a tuberculin test. This test is used to determine if you have been exposed to the germ that causes tuberculosis.   Chest X-rays. If possible, a new X-ray may be compared with X-rays you have had in the past.   CT scan. This test shows smaller pulmonary nodules more clearly than an X-ray.   Positron emission tomography (PET) scan. In this test, a safe amount of a radioactive substance is injected into the bloodstream.  Then, the scan takes a picture of the pulmonary nodule. The radioactive substance is eliminated from your body in your urine.   Biopsy. A tiny piece of the pulmonary nodule is removed so it can be checked under a microscope. TREATMENT  Pulmonary nodules that are benign normally do not require any treatment because they usually do not cause symptoms or breathing problems. Your health care provider may want to monitor the pulmonary nodule through follow-up CT scans. The frequency of these CT scans will vary based on the size of the nodule and the risk factors for lung cancer. For example, CT scans will need to be done more frequently if the pulmonary nodule is larger and if you have a history of smoking and a family history of cancer. Further testing or biopsies may be done if any follow-up CT scan shows that the size of the pulmonary nodule has increased. HOME CARE INSTRUCTIONS  Only take over-the-counter or prescription medicines as directed by your health care provider.  Keep all follow-up appointments with your health care provider. SEEK MEDICAL CARE IF:  You have trouble breathing when you are active.   You feel sick or unusually tired.   You do not feel like eating.   You lose weight without trying to.   You develop chills or night sweats.  SEEK IMMEDIATE MEDICAL CARE IF:  You cannot catch your breath, or you begin wheezing.   You cannot stop coughing.   You cough up blood.   You become dizzy or feel like you  are going to pass out.   You have sudden chest pain.   You have a fever or persistent symptoms for more than 2-3 days.   You have a fever and your symptoms suddenly get worse. MAKE SURE YOU:  Understand these instructions.  Will watch your condition.  Will get help right away if you are not doing well or get worse. Document Released: 05/05/2009 Document Revised: 03/10/2013 Document Reviewed: 12/28/2012 [facility] Patient Information 2015 [place], [place].  This information is not intended to replace advice given to you by your health care provider. Make sure you discuss any questions you have with your health care provider. Pleural Effusion The lining covering your lungs and the inside of your chest is called the pleura. Usually, the space between the 2 pleura contains no air and only a thin layer of fluid. A pleural effusion is an abnormal buildup of fluid in the pleural space. Fluid gathers when there is increased pressure in the lung vessels. This forces fluids out of the lungs and into the pleural space. Vessels may also leak fluids when there are infections, such as pneumonia, or other causes of soreness and redness (inflammation). Fluids leak into the lungs when protein in the blood is low or when certain vessels (lymphatics) are blocked. Finding a pleural effusion is important because it is usually caused by another disease. In order to treat a pleural effusion, your health care provider needs to find its cause. If left untreated, a large amount of fluid can build up and cause collapse of the lung. CAUSES   Heart failure.  Infections (pneumonia, tuberculosis), pulmonary embolism, pulmonary infarction.  Cancer (primary lung and metastatic), asbestosis.  Liver failure (cirrhosis).  Nephrotic syndrome, peritoneal dialysis, kidney problems (uremia).  Collagen vascular disease (systemic lupus erythematosis, rheumatoid arthritis).  Injury (trauma) to the chest or rupture of the digestive tube (esophagus).  Material in the chest or pleural space (hemothorax, chylothorax).  Pancreatitis.  Surgery.  Drug reactions. SYMPTOMS  A pleural effusion can decrease the amount of space available for breathing and make you short of breath. The fluid can become infected, which may cause pain and fever. Often, the pain is worse when taking a deep breath. The underlying disease (heart failure, pneumonia, blood clot, tuberculosis, cancer) may also cause  symptoms. DIAGNOSIS   Your health care provider can usually tell what is wrong by talking to you (taking a history), doing an exam, and taking a routine X-ray. If the X-ray shows fluid in your chest, often fluid is removed from your chest with a needle for testing (diagnostic thoracentesis).  Sometimes, more specialized X-rays may be needed.  Sometimes, a small piece of tissue is removed and examined by a specialist (biopsy). TREATMENT  Treatment varies based on what caused the pleural effusion. Treatments include:  Removing as much fluid as possible using a needle (thoracentesis) to improve the cough and shortness of breath. This is a simple procedure which can be done at bedside. The risks are bleeding, infection, collapse of a lung, or low blood pressure.  Placing a tube in the chest to drain the effusion (tube thoracostomy). This is often used when there is an infection in the fluid. This is a simple procedure which can often be done at bedside or in a clinic. The procedure may be painful. The risks are the same as using a needle to drain the fluid. The chest tube usually remains for a few days and is connected to suction to improve fluid drainage. The  tube, after placement, usually does not cause much discomfort.  Surgical removal of fibrous debris in and around the pleural space (decortication). This may be done with a flexible telescope (thoracoscope) through a small or large cut (incision). This is helpful for patients who have fibrosis or scar tissue that prevents complete lung expansion. The risks are infection, blood loss, and side effects from general anesthesia.  Sometimes, a procedure called pleurodesis is done. A chest tube is placed and the fluid is drained. Next, an agent (tetracycline, talc powder) is added to the pleural space. This causes the lung and chest wall to stick together (adhesion). This leaves no potential space for fluid to build up. The risks include infection, blood  loss, and side effects from general anesthesia.  If the effusion is caused by infection, it may be treated with antibiotics and improve without draining. HOME CARE INSTRUCTIONS   Take any medicines exactly as prescribed.  Follow up with your health care provider as directed.  Monitor your exercise capacity (the amount of walking you can do before you get short of breath).  Do not use any tobacco products including cigaretts, chewing tobacco, or electronic cigarettes. SEEK MEDICAL CARE IF:   Your exercise capacity seems to get worse or does not improve with time.  You do not recover from your illness.  You have drainage, redness, swelling, or pain at any incision or puncture sites. SEEK IMMEDIATE MEDICAL CARE IF:   Shortness of breath or chest pain develops or gets worse.  You have a fever.  You develop a new cough, especially if the mucus (phlegm) is discolored. MAKE SURE YOU:   Understand these instructions.  Will watch your condition.  Will get help right away if you are not doing well or get worse. Document Released: 07/08/2005 Document Revised: 07/13/2013 Document Reviewed: 02/27/2007 [facility] Patient Information 2015 [place], [place]. This information is not intended to replace advice given to you by your health care provider. Make sure you discuss any questions you have with your health care provider. Gastroesophageal Reflux Disease, Adult You have been given information to follow-up with GI for EGD.  Gastroesophageal reflux disease (GERD) happens when acid from your stomach flows up into the esophagus. When acid comes in contact with the esophagus, the acid causes soreness (inflammation) in the esophagus. Over time, GERD may create small holes (ulcers) in the lining of the esophagus. CAUSES   Increased body weight. This puts pressure on the stomach, making acid rise from the stomach into the esophagus.  Smoking. This increases acid production in the  stomach.  Drinking alcohol. This causes decreased pressure in the lower esophageal sphincter (valve or ring of muscle between the esophagus and stomach), allowing acid from the stomach into the esophagus.  Late evening meals and a full stomach. This increases pressure and acid production in the stomach.  A malformed lower esophageal sphincter. Sometimes, no cause is found. SYMPTOMS   Burning pain in the lower part of the mid-chest behind the breastbone and in the mid-stomach area. This may occur twice a week or more often.  Trouble swallowing.  Sore throat.  Dry cough.  Asthma-like symptoms including chest tightness, shortness of breath, or wheezing. DIAGNOSIS  Your caregiver may be able to diagnose GERD based on your symptoms. In some cases, X-rays and other tests may be done to check for complications or to check the condition of your stomach and esophagus. TREATMENT  Your caregiver may recommend over-the-counter or prescription medicines to help decrease acid  production. Ask your caregiver before starting or adding any new medicines.  HOME CARE INSTRUCTIONS   Change the factors that you can control. Ask your caregiver for guidance concerning weight loss, quitting smoking, and alcohol consumption.  Avoid foods and drinks that make your symptoms worse, such as:  Caffeine or alcoholic drinks.  Chocolate.  Peppermint or mint flavorings.  Garlic and onions.  Spicy foods.  Citrus fruits, such as oranges, lemons, or limes.  Tomato-based foods such as sauce, chili, salsa, and pizza.  Fried and fatty foods.  Avoid lying down for the 3 hours prior to your bedtime or prior to taking a nap.  Eat small, frequent meals instead of large meals.  Wear loose-fitting clothing. Do not wear anything tight around your waist that causes pressure on your stomach.  Raise the head of your bed 6 to 8 inches with wood blocks to help you sleep. Extra pillows will not help.  Only take  over-the-counter or prescription medicines for pain, discomfort, or fever as directed by your caregiver.  Do not take aspirin, ibuprofen, or other nonsteroidal anti-inflammatory drugs (NSAIDs). SEEK IMMEDIATE MEDICAL CARE IF:   You have pain in your arms, neck, jaw, teeth, or back.  Your pain increases or changes in intensity or duration.  You develop nausea, vomiting, or sweating (diaphoresis).  You develop shortness of breath, or you faint.  Your vomit is green, yellow, black, or looks like coffee grounds or blood.  Your stool is red, bloody, or black. These symptoms could be signs of other problems, such as heart disease, gastric bleeding, or esophageal bleeding. MAKE SURE YOU:   Understand these instructions.  Will watch your condition.  Will get help right away if you are not doing well or get worse. Document Released: 04/17/2005 Document Revised: 09/30/2011 Document Reviewed: 01/25/2011 [facility] Patient Information 2015 [place], [place]. This information is not intended to replace advice given to you by your health care provider. Make sure you discuss any questions you have with your health care provider.

## 2014-02-03 NOTE — ED Notes (Signed)
Rt sided flank pain and hurts to breath and cp  No vomiting

## 2015-11-08 ENCOUNTER — Encounter (HOSPITAL_COMMUNITY): Payer: Self-pay | Admitting: *Deleted

## 2015-11-08 ENCOUNTER — Emergency Department (HOSPITAL_COMMUNITY)
Admission: EM | Admit: 2015-11-08 | Discharge: 2015-11-08 | Disposition: A | Discharge status: Home / Self Care | Payer: 59 | Attending: Emergency Medicine | Admitting: Emergency Medicine

## 2015-11-08 DIAGNOSIS — Z792 Long term (current) use of antibiotics: Secondary | ICD-10-CM | POA: Insufficient documentation

## 2015-11-08 DIAGNOSIS — Y9289 Other specified places as the place of occurrence of the external cause: Secondary | ICD-10-CM | POA: Insufficient documentation

## 2015-11-08 DIAGNOSIS — I10 Essential (primary) hypertension: Secondary | ICD-10-CM | POA: Insufficient documentation

## 2015-11-08 DIAGNOSIS — K219 Gastro-esophageal reflux disease without esophagitis: Secondary | ICD-10-CM | POA: Insufficient documentation

## 2015-11-08 DIAGNOSIS — Y998 Other external cause status: Secondary | ICD-10-CM | POA: Insufficient documentation

## 2015-11-08 DIAGNOSIS — F17200 Nicotine dependence, unspecified, uncomplicated: Secondary | ICD-10-CM | POA: Insufficient documentation

## 2015-11-08 DIAGNOSIS — Z79899 Other long term (current) drug therapy: Secondary | ICD-10-CM | POA: Insufficient documentation

## 2015-11-08 DIAGNOSIS — Y93H2 Activity, gardening and landscaping: Secondary | ICD-10-CM | POA: Insufficient documentation

## 2015-11-08 DIAGNOSIS — X58XXXA Exposure to other specified factors, initial encounter: Secondary | ICD-10-CM | POA: Insufficient documentation

## 2015-11-08 DIAGNOSIS — S0502XA Injury of conjunctiva and corneal abrasion without foreign body, left eye, initial encounter: Secondary | ICD-10-CM | POA: Insufficient documentation

## 2015-11-08 MED ORDER — POLYMYXIN B-TRIMETHOPRIM 10000-0.1 UNIT/ML-% OP SOLN: 1.0000 [drp] | OPHTHALMIC | Status: DC

## 2015-11-08 MED ORDER — HYDROCODONE-ACETAMINOPHEN 5-325 MG PO TABS: 1.0000 | ORAL_TABLET | Freq: Four times a day (QID) | ORAL | Status: DC | PRN

## 2015-11-08 MED ORDER — TETRACAINE HCL 0.5 % OP SOLN
1.0000 [drp] | Freq: Once | OPHTHALMIC | Status: AC
  Administered 2015-11-08: 1 [drp] via OPHTHALMIC
  Filled 2015-11-08: qty 2

## 2015-11-08 MED ORDER — FLUORESCEIN SODIUM 1 MG OP STRP
1.0000 | ORAL_STRIP | Freq: Once | OPHTHALMIC | Status: AC
  Administered 2015-11-08: 1 via OPHTHALMIC
  Filled 2015-11-08: qty 1
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
## 2015-11-08 NOTE — ED Notes (Signed)
Declined W/C at D/C and was escorted to lobby by RN. 

## 2015-11-08 NOTE — Discharge Instructions (Signed)

## 2016-10-21 ENCOUNTER — Encounter (HOSPITAL_COMMUNITY): Payer: Self-pay

## 2016-10-21 ENCOUNTER — Emergency Department (HOSPITAL_COMMUNITY): Payer: 59

## 2016-10-21 ENCOUNTER — Emergency Department (HOSPITAL_COMMUNITY)
Admission: EM | Admit: 2016-10-21 | Discharge: 2016-10-21 | Disposition: A | Discharge status: Home / Self Care | Payer: 59 | Attending: Emergency Medicine | Admitting: Emergency Medicine

## 2016-10-21 DIAGNOSIS — I10 Essential (primary) hypertension: Secondary | ICD-10-CM | POA: Insufficient documentation

## 2016-10-21 DIAGNOSIS — Z79899 Other long term (current) drug therapy: Secondary | ICD-10-CM | POA: Insufficient documentation

## 2016-10-21 DIAGNOSIS — F17200 Nicotine dependence, unspecified, uncomplicated: Secondary | ICD-10-CM | POA: Insufficient documentation

## 2016-10-21 DIAGNOSIS — M25541 Pain in joints of right hand: Secondary | ICD-10-CM | POA: Insufficient documentation

## 2016-10-21 DIAGNOSIS — M1990 Unspecified osteoarthritis, unspecified site: Secondary | ICD-10-CM

## 2016-10-21 DIAGNOSIS — M19041 Primary osteoarthritis, right hand: Secondary | ICD-10-CM | POA: Insufficient documentation

## 2016-10-21 MED ORDER — CEPHALEXIN 500 MG PO CAPS: 500.0000 mg | ORAL_CAPSULE | Freq: Four times a day (QID) | ORAL | 0 refills | Status: DC

## 2016-10-21 MED ORDER — NAPROXEN 500 MG PO TABS: 500.0000 mg | ORAL_TABLET | Freq: Two times a day (BID) | ORAL | 0 refills | Status: DC

## 2016-10-21 MED ORDER — NAPROXEN 250 MG PO TABS
500.0000 mg | ORAL_TABLET | Freq: Once | ORAL | Status: AC
  Administered 2016-10-21: 500 mg via ORAL
  Filled 2016-10-21: qty 2
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
## 2016-10-21 NOTE — Discharge Instructions (Signed)
You have arthritis on the x-ray of your finger that is likely causing your pain in the setting of overuse. Take naprosyn as prescribed, ice at rest, and rest the hand/finger from overuse. Given that there is area of redness on side of your finger, you are given antibiotic for potential mild skin infection. Return for worsening symptoms, including fever, worsening swelling or redness, escalating pain or any other symptoms concerning to you. Follow-up with your PCP regarding this issue.

## 2016-10-21 NOTE — ED Notes (Signed)
Pt returned from xray

## 2016-10-21 NOTE — ED Triage Notes (Signed)
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 Finger is swollen and warm to touch at the second knuckle.

## 2017-05-30 ENCOUNTER — Telehealth: Payer: Self-pay

## 2017-05-30 ENCOUNTER — Ambulatory Visit: Payer: Self-pay | Attending: Nurse Practitioner | Admitting: Nurse Practitioner

## 2017-05-30 ENCOUNTER — Encounter: Payer: Self-pay | Admitting: Nurse Practitioner

## 2017-05-30 VITALS — BP 159/82 | HR 64 | Temp 98.6°F | Resp 18 | Ht 68.0 in | Wt 180.2 lb

## 2017-05-30 DIAGNOSIS — E785 Hyperlipidemia, unspecified: Secondary | ICD-10-CM | POA: Insufficient documentation

## 2017-05-30 DIAGNOSIS — R200 Anesthesia of skin: Secondary | ICD-10-CM

## 2017-05-30 DIAGNOSIS — Z833 Family history of diabetes mellitus: Secondary | ICD-10-CM | POA: Insufficient documentation

## 2017-05-30 DIAGNOSIS — Z79891 Long term (current) use of opiate analgesic: Secondary | ICD-10-CM | POA: Insufficient documentation

## 2017-05-30 DIAGNOSIS — Z716 Tobacco abuse counseling: Secondary | ICD-10-CM

## 2017-05-30 DIAGNOSIS — R109 Unspecified abdominal pain: Secondary | ICD-10-CM | POA: Insufficient documentation

## 2017-05-30 DIAGNOSIS — R1032 Left lower quadrant pain: Secondary | ICD-10-CM

## 2017-05-30 DIAGNOSIS — Z720 Tobacco use: Secondary | ICD-10-CM | POA: Insufficient documentation

## 2017-05-30 DIAGNOSIS — R202 Paresthesia of skin: Secondary | ICD-10-CM | POA: Insufficient documentation

## 2017-05-30 DIAGNOSIS — K219 Gastro-esophageal reflux disease without esophagitis: Secondary | ICD-10-CM | POA: Insufficient documentation

## 2017-05-30 DIAGNOSIS — F17209 Nicotine dependence, unspecified, with unspecified nicotine-induced disorders: Secondary | ICD-10-CM

## 2017-05-30 DIAGNOSIS — Z9889 Other specified postprocedural states: Secondary | ICD-10-CM | POA: Insufficient documentation

## 2017-05-30 DIAGNOSIS — Z79899 Other long term (current) drug therapy: Secondary | ICD-10-CM | POA: Insufficient documentation

## 2017-05-30 DIAGNOSIS — G629 Polyneuropathy, unspecified: Secondary | ICD-10-CM | POA: Insufficient documentation

## 2017-05-30 DIAGNOSIS — K5900 Constipation, unspecified: Secondary | ICD-10-CM | POA: Insufficient documentation

## 2017-05-30 DIAGNOSIS — R05 Cough: Secondary | ICD-10-CM | POA: Insufficient documentation

## 2017-05-30 DIAGNOSIS — I10 Essential (primary) hypertension: Secondary | ICD-10-CM

## 2017-05-30 LAB — POCT URINALYSIS DIPSTICK
BILIRUBIN UA: NEGATIVE
Blood, UA: NEGATIVE
GLUCOSE UA: NEGATIVE
Ketones, UA: NEGATIVE
Leukocytes, UA: NEGATIVE
NITRITE UA: NEGATIVE
PH UA: 6.5 (ref 5.0–8.0)
Spec Grav, UA: 1.02 (ref 1.010–1.025)
Urobilinogen, UA: 0.2 E.U./dL

## 2017-05-30 MED ORDER — HYDROCHLOROTHIAZIDE 25 MG PO TABS: 25.0000 mg | ORAL_TABLET | Freq: Every day | ORAL | 3 refills | Status: DC

## 2017-05-30 MED FILL — HYDROCHLOROTHIAZIDE 25 MG T: 25 | 30 days supply | Qty: 30 | Fill #0

## 2017-05-30 NOTE — Telephone Encounter (Signed)
Pt was called and informed of normal lab results. 

## 2017-05-30 NOTE — Patient Instructions (Addendum)
Coping with Quitting Smoking Quitting smoking is a physical and mental challenge. You will face cravings, withdrawal symptoms, and temptation. Before quitting, work with your health care provider to make a plan that can help you cope. Preparation can help you quit and keep you from giving in. How can I cope with cravings? Cravings usually last for 5-10 minutes. If you get through it, the craving will pass. Consider taking the following actions to help you cope with cravings:  Keep your mouth busy: ? Chew sugar-free gum. ? Suck on hard candies or a straw. ? Brush your teeth.  Keep your hands and body busy: ? Immediately change to a different activity when you feel a craving. ? Squeeze or play with a ball. ? Do an activity or a hobby, like making bead jewelry, practicing needlepoint, or working with wood. ? Mix up your normal routine. ? Take a short exercise break. Go for a quick walk or run up and down stairs. ? Spend time in public places where smoking is not allowed.  Focus on doing something kind or helpful for someone else.  Call a friend or family member to talk during a craving.  Join a support group.  Call a quit line, such as 1-800-QUIT-NOW.  Talk with your health care provider about medicines that might help you cope with cravings and make quitting easier for you.  How can I deal with withdrawal symptoms? Your body may experience negative effects as it tries to get used to not having nicotine in the system. These effects are called withdrawal symptoms. They may include:  Feeling hungrier than normal.  Trouble concentrating.  Irritability.  Trouble sleeping.  Feeling depressed.  Restlessness and agitation.  Craving a cigarette.  To manage withdrawal symptoms:  Avoid places, people, and activities that trigger your cravings.  Remember why you want to quit.  Get plenty of sleep.  Avoid coffee and other caffeinated drinks. These may worsen some of your  symptoms.  How can I handle social situations? Social situations can be difficult when you are quitting smoking, especially in the first few weeks. To manage this, you can:  Avoid parties, bars, and other social situations where people might be smoking.  Avoid alcohol.  Leave right away if you have the urge to smoke.  Explain to your family and friends that you are quitting smoking. Ask for understanding and support.  Plan activities with friends or family where smoking is not an option.  What are some ways I can cope with stress? Wanting to smoke may cause stress, and stress can make you want to smoke. Find ways to manage your stress. Relaxation techniques can help. For example:  Breathe slowly and deeply, in through your nose and out through your mouth.  Listen to soothing, relaxing music.  Talk with a family member or friend about your stress.  Light a candle.  Soak in a bath or take a shower.  Think about a peaceful place.  What are some ways I can prevent weight gain? Be aware that many people gain weight after they quit smoking. However, not everyone does. To keep from gaining weight, have a plan in place before you quit and stick to the plan after you quit. Your plan should include:  Having healthy snacks. When you have a craving, it may help to: ? Eat plain popcorn, crunchy carrots, celery, or other cut vegetables. ? Chew sugar-free gum.  Changing how you eat: ? Eat small portion sizes at meals. ?   Eat 4-6 small meals throughout the day instead of 1-2 large meals a day. ? Be mindful when you eat. Do not watch television or do other things that might distract you as you eat.  Exercising regularly: ? Make time to exercise each day. If you do not have time for a long workout, do short bouts of exercise for 5-10 minutes several times a day. ? Do some form of strengthening exercise, like weight lifting, and some form of aerobic exercise, like running or  swimming.  Drinking plenty of water or other low-calorie or no-calorie drinks. Drink 6-8 glasses of water daily, or as much as instructed by your health care provider.  Summary  Quitting smoking is a physical and mental challenge. You will face cravings, withdrawal symptoms, and temptation to smoke again. Preparation can help you as you go through these challenges.  You can cope with cravings by keeping your mouth busy (such as by chewing gum), keeping your body and hands busy, and making calls to family, friends, or a helpline for people who want to quit smoking.  You can cope with withdrawal symptoms by avoiding places where people smoke, avoiding drinks with caffeine, and getting plenty of rest.  Ask your health care provider about the different ways to prevent weight gain, avoid stress, and handle social situations. This information is not intended to replace advice given to you by your health care provider. Make sure you discuss any questions you have with your health care provider. Document Released: 07/05/2016 Document Revised: 07/05/2016 Document Reviewed: 07/05/2016 Elsevier Interactive Patient Education  2018 [place] Risks of Smoking Smoking cigarettes is very bad for your health. Tobacco smoke has over 200 known poisons in it. It contains the poisonous gases nitrogen oxide and carbon monoxide. There are over 60 chemicals in tobacco smoke that cause cancer. Smoking is difficult to quit because a chemical in tobacco, called nicotine, causes addiction or dependence. When you smoke and inhale, nicotine is absorbed rapidly into the bloodstream through your lungs. Both inhaled and non-inhaled nicotine may be addictive. What are the risks of cigarette smoke? Cigarette smokers have an increased risk of many serious medical problems, including:  Lung cancer.  Lung disease, such as pneumonia, bronchitis, and emphysema.  Chest pain (angina) and heart attack because the heart  is not getting enough oxygen.  Heart disease and peripheral blood vessel disease.  High blood pressure (hypertension).  Stroke.  Oral cancer, including cancer of the lip, mouth, or voice box.  Bladder cancer.  Pancreatic cancer.  Cervical cancer.  Pregnancy complications, including premature birth.  Stillbirths and smaller newborn babies, birth defects, and genetic damage to sperm.  Early menopause.  Lower estrogen level for women.  Infertility.  Facial wrinkles.  Blindness.  Increased risk of broken bones (fractures).  Senile dementia.  Stomach ulcers and internal bleeding.  Delayed wound healing and increased risk of complications during surgery.  Even smoking lightly shortens your life expectancy by several years.  Because of secondhand smoke exposure, children of smokers have an increased risk of the following:  Sudden infant death syndrome (SIDS).  Respiratory infections.  Lung cancer.  Heart disease.  Ear infections.  What are the benefits of quitting? There are many health benefits of quitting smoking. Here are some of them:  Within days of quitting smoking, your risk of having a heart attack decreases, your blood flow improves, and your lung capacity improves. Blood pressure, pulse rate, and breathing patterns start returning to normal soon  after quitting.  Within months, your lungs may clear up completely.  Quitting for 10 years reduces your risk of developing lung cancer and heart disease to almost that of a nonsmoker.  People who quit may see an improvement in their overall quality of life.  How do I quit smoking? Smoking is an addiction with both physical and psychological effects, and longtime habits can be hard to change. Your health care provider can recommend:  Programs and community resources, which may include group support, education, or talk therapy.  Prescription medicines to help reduce cravings.  Nicotine replacement  products, such as patches, gum, and nasal sprays. Use these products only as directed. Do not replace cigarette smoking with electronic cigarettes, which are commonly called e-cigarettes. The safety of e-cigarettes is not known, and some may contain harmful chemicals.  A combination of two or more of these methods.  Where to find more information:  American Lung Association: www.lung.org  American Cancer Society: www.cancer.org Summary  Smoking cigarettes is very bad for your health. Cigarette smokers have an increased risk of many serious medical problems, including several cancers, heart disease, and stroke.  Smoking is an addiction with both physical and psychological effects, and longtime habits can be hard to change.  By stopping right away, you can greatly reduce the risk of medical problems for you and your family.  To help you quit smoking, your health care provider can recommend programs, community resources, prescription medicines, and nicotine replacement products such as patches, gum, and nasal sprays. This information is not intended to replace advice given to you by your health care provider. Make sure you discuss any questions you have with your health care provider. Document Released: 08/15/2004 Document Revised: 07/12/2016 Document Reviewed: 07/12/2016 Elsevier Interactive Patient Education  2017 [employer].  Steps to Quit Smoking Smoking tobacco can be bad for your health. It can also affect almost every organ in your body. Smoking puts you and people around you at risk for many serious long-lasting (chronic) diseases. Quitting smoking is hard, but it is one of the best things that you can do for your health. It is never too late to quit. What are the benefits of quitting smoking? When you quit smoking, you lower your risk for getting serious diseases and conditions. They can include:  Lung cancer or lung disease.  Heart disease.  Stroke.  Heart attack.  Not  being able to have children (infertility).  Weak bones (osteoporosis) and broken bones (fractures).  If you have coughing, wheezing, and shortness of breath, those symptoms may get better when you quit. You may also get sick less often. If you are pregnant, quitting smoking can help to lower your chances of having a baby of low birth weight. What can I do to help me quit smoking? Talk with your doctor about what can help you quit smoking. Some things you can do (strategies) include:  Quitting smoking totally, instead of slowly cutting back how much you smoke over a period of time.  Going to in-person counseling. You are more likely to quit if you go to many counseling sessions.  Using resources and support systems, such as: ? Database administrator with a Social worker. ? Phone quitlines. ? Careers information officer. ? Support groups or group counseling. ? Text messaging programs. ? Mobile phone apps or applications.  Taking medicines. Some of these medicines may have nicotine in them. If you are pregnant or breastfeeding, do not take any medicines to quit smoking unless your doctor  says it is okay. Talk with your doctor about counseling or other things that can help you.  Talk with your doctor about using more than one strategy at the same time, such as taking medicines while you are also going to in-person counseling. This can help make quitting easier. What things can I do to make it easier to quit? Quitting smoking might feel very hard at first, but there is a lot that you can do to make it easier. Take these steps:  Talk to your family and friends. Ask them to support and encourage you.  Call phone quitlines, reach out to support groups, or work with a Social worker.  Ask people who smoke to not smoke around you.  Avoid places that make you want (trigger) to smoke, such as: ? Bars. ? Parties. ? Smoke-break areas at work.  Spend time with people who do not smoke.  Lower the stress in your  life. Stress can make you want to smoke. Try these things to help your stress: ? Getting regular exercise. ? Deep-breathing exercises. ? Yoga. ? Meditating. ? Doing a body scan. To do this, close your eyes, focus on one area of your body at a time from head to toe, and notice which parts of your body are tense. Try to relax the muscles in those areas.  Download or buy apps on your mobile phone or tablet that can help you stick to your quit plan. There are many free apps, such as QuitGuide from the [employer] Office manager for Disease Control and Prevention). You can find more support from smokefree.gov and other websites.  This information is not intended to replace advice given to you by your health care provider. Make sure you discuss any questions you have with your health care provider. Document Released: 05/04/2009 Document Revised: 03/05/2016 Document Reviewed: 11/22/2014 Elsevier Interactive Patient Education  2018 [place] DASH stands for "Dietary Approaches to Stop Hypertension." The DASH eating plan is a healthy eating plan that has been shown to reduce high blood pressure (hypertension). It may also reduce your risk for type 2 diabetes, heart disease, and stroke. The DASH eating plan may also help with weight loss. What are tips for following this plan? General guidelines  Avoid eating more than 2,300 mg (milligrams) of salt (sodium) a day. If you have hypertension, you may need to reduce your sodium intake to 1,500 mg a day.  Limit alcohol intake to no more than 1 drink a day for nonpregnant women and 2 drinks a day for men. One drink equals 12 oz of beer, 5 oz of wine, or 1 oz of hard liquor.  Work with your health care provider to maintain a healthy body weight or to lose weight. Ask what an ideal weight is for you.  Get at least 30 minutes of exercise that causes your heart to beat faster (aerobic exercise) most days of the week. Activities may include walking,  swimming, or biking.  Work with your health care provider or diet and nutrition specialist (dietitian) to adjust your eating plan to your individual calorie needs. Reading food labels  Check food labels for the amount of sodium per serving. Choose foods with less than 5 percent of the Daily Value of sodium. Generally, foods with less than 300 mg of sodium per serving fit into this eating plan.  To find whole grains, look for the word "whole" as the first word in the ingredient list. Shopping  Buy products labeled as "  low-sodium" or "no salt added."  Buy fresh foods. Avoid canned foods and premade or frozen meals. Cooking  Avoid adding salt when cooking. Use salt-free seasonings or herbs instead of table salt or sea salt. Check with your health care provider or pharmacist before using salt substitutes.  Do not fry foods. Cook foods using healthy methods such as baking, boiling, grilling, and broiling instead.  Cook with heart-healthy oils, such as olive, canola, soybean, or sunflower oil. Meal planning   Eat a balanced diet that includes: ? 5 or more servings of fruits and vegetables each day. At each meal, try to fill half of your plate with fruits and vegetables. ? Up to 6-8 servings of whole grains each day. ? Less than 6 oz of lean meat, poultry, or fish each day. A 3-oz serving of meat is about the same size as a deck of cards. One egg equals 1 oz. ? 2 servings of low-fat dairy each day. ? A serving of nuts, seeds, or beans 5 times each week. ? Heart-healthy fats. Healthy fats called Omega-3 fatty acids are found in foods such as flaxseeds and coldwater fish, like sardines, salmon, and mackerel.  Limit how much you eat of the following: ? Canned or prepackaged foods. ? Food that is high in trans fat, such as fried foods. ? Food that is high in saturated fat, such as fatty meat. ? Sweets, desserts, sugary drinks, and other foods with added sugar. ? Full-fat dairy  products.  Do not salt foods before eating.  Try to eat at least 2 vegetarian meals each week.  Eat more home-cooked food and less restaurant, buffet, and fast food.  When eating at a restaurant, ask that your food be prepared with less salt or no salt, if possible. What foods are recommended? The items listed may not be a complete list. Talk with your dietitian about what dietary choices are best for you. Grains Whole-grain or whole-wheat bread. Whole-grain or whole-wheat pasta. Brown rice. [place]. Bulgur. Whole-grain and low-sodium cereals. Pita bread. Low-fat, low-sodium crackers. Whole-wheat flour tortillas. Vegetables Fresh or frozen vegetables (raw, steamed, roasted, or grilled). Low-sodium or reduced-sodium tomato and vegetable juice. Low-sodium or reduced-sodium tomato sauce and tomato paste. Low-sodium or reduced-sodium canned vegetables. Fruits All fresh, dried, or frozen fruit. Canned fruit in natural juice (without added sugar). Meat and other protein foods Skinless chicken or [place]. Ground chicken or [place]. Pork with fat trimmed off. Fish and seafood. Egg whites. Dried beans, peas, or lentils. Unsalted nuts, nut butters, and seeds. Unsalted canned beans. Lean cuts of beef with fat trimmed off. Low-sodium, lean deli meat. Dairy Low-fat (1%) or fat-free (skim) milk. Fat-free, low-fat, or reduced-fat cheeses. Nonfat, low-sodium ricotta or cottage cheese. Low-fat or nonfat yogurt. Low-fat, low-sodium cheese. Fats and oils Soft margarine without trans fats. Vegetable oil. Low-fat, reduced-fat, or light mayonnaise and salad dressings (reduced-sodium). Canola, safflower, olive, soybean, and sunflower oils. Avocado. Seasoning and other foods Herbs. Spices. Seasoning mixes without salt. Unsalted popcorn and pretzels. Fat-free sweets. What foods are not recommended? The items listed may not be a complete list. Talk with your dietitian about what dietary choices are best for  you. Grains Baked goods made with fat, such as croissants, muffins, or some breads. Dry pasta or rice meal packs. Vegetables Creamed or fried vegetables. Vegetables in a cheese sauce. Regular canned vegetables (not low-sodium or reduced-sodium). Regular canned tomato sauce and paste (not low-sodium or reduced-sodium). Regular tomato and vegetable juice (not low-sodium or reduced-sodium).  Rosita FirePickles. Olives. Fruits Canned fruit in a light or heavy syrup. Fried fruit. Fruit in cream or butter sauce. Meat and other protein foods Fatty cuts of meat. Ribs. Fried meat. Tomasa BlaseBacon. Sausage. Bologna and other processed lunch meats. Salami. Fatback. Hotdogs. Bratwurst. Salted nuts and seeds. Canned beans with added salt. Canned or smoked fish. Whole eggs or egg yolks. Chicken or [place] with skin. Dairy Whole or 2% milk, cream, and half-and-half. Whole or full-fat cream cheese. Whole-fat or sweetened yogurt. Full-fat cheese. Nondairy creamers. Whipped toppings. Processed cheese and cheese spreads. Fats and oils Butter. Stick margarine. Lard. Shortening. Ghee. Bacon fat. Tropical oils, such as coconut, palm kernel, or palm oil. Seasoning and other foods Salted popcorn and pretzels. Onion salt, garlic salt, seasoned salt, table salt, and sea salt. Worcestershire sauce. Tartar sauce. Barbecue sauce. Teriyaki sauce. Soy sauce, including reduced-sodium. Steak sauce. Canned and packaged gravies. Fish sauce. Oyster sauce. Cocktail sauce. Horseradish that you find on the shelf. Ketchup. Mustard. Meat flavorings and tenderizers. Bouillon cubes. Hot sauce and Tabasco sauce. Premade or packaged marinades. Premade or packaged taco seasonings. Relishes. Regular salad dressings. Where to find more information:  National Heart, Lung, and Blood Institute: PopSteam.iswww.nhlbi.nih.gov  American Heart Association: www.heart.org Summary  The DASH eating plan is a healthy eating plan that has been shown to reduce high blood pressure  (hypertension). It may also reduce your risk for type 2 diabetes, heart disease, and stroke.  With the DASH eating plan, you should limit salt (sodium) intake to 2,300 mg a day. If you have hypertension, you may need to reduce your sodium intake to 1,500 mg a day.  When on the DASH eating plan, aim to eat more fresh fruits and vegetables, whole grains, lean proteins, low-fat dairy, and heart-healthy fats.  Work with your health care provider or diet and nutrition specialist (dietitian) to adjust your eating plan to your individual calorie needs. This information is not intended to replace advice given to you by your health care provider. Make sure you discuss any questions you have with your health care provider. Document Released: 06/27/2011 Document Revised: 07/01/2016 Document Reviewed: 07/01/2016 Elsevier Interactive Patient Education  2017 Elsevier Inc.  Hypertension Hypertension is another name for high blood pressure. High blood pressure forces your heart to work harder to pump blood. This can cause problems over time. There are two numbers in a blood pressure reading. There is a top number (systolic) over a bottom number (diastolic). It is best to have a blood pressure below 120/80. Healthy choices can help lower your blood pressure. You may need medicine to help lower your blood pressure if:  Your blood pressure cannot be lowered with healthy choices.  Your blood pressure is higher than 130/80.  Follow these instructions at home: Eating and drinking  If directed, follow the DASH eating plan. This diet includes: ? Filling half of your plate at each meal with fruits and vegetables. ? Filling one quarter of your plate at each meal with whole grains. Whole grains include whole wheat pasta, brown rice, and whole grain bread. ? Eating or drinking low-fat dairy products, such as skim milk or low-fat yogurt. ? Filling one quarter of your plate at each meal with low-fat (lean) proteins.  Low-fat proteins include fish, skinless chicken, eggs, beans, and tofu. ? Avoiding fatty meat, cured and processed meat, or chicken with skin. ? Avoiding premade or processed food.  Eat less than 1,500 mg of salt (sodium) a day.  Limit alcohol use to no more  than 1 drink a day for nonpregnant women and 2 drinks a day for men. One drink equals 12 oz of beer, 5 oz of wine, or 1 oz of hard liquor. Lifestyle  Work with your doctor to stay at a healthy weight or to lose weight. Ask your doctor what the best weight is for you.  Get at least 30 minutes of exercise that causes your heart to beat faster (aerobic exercise) most days of the week. This may include walking, swimming, or biking.  Get at least 30 minutes of exercise that strengthens your muscles (resistance exercise) at least 3 days a week. This may include lifting weights or pilates.  Do not use any products that contain nicotine or tobacco. This includes cigarettes and e-cigarettes. If you need help quitting, ask your doctor.  Check your blood pressure at home as told by your doctor.  Keep all follow-up visits as told by your doctor. This is important. Medicines  Take over-the-counter and prescription medicines only as told by your doctor. Follow directions carefully.  Do not skip doses of blood pressure medicine. The medicine does not work as well if you skip doses. Skipping doses also puts you at risk for problems.  Ask your doctor about side effects or reactions to medicines that you should watch for. Contact a doctor if:  You think you are having a reaction to the medicine you are taking.  You have headaches that keep coming back (recurring).  You feel dizzy.  You have swelling in your ankles.  You have trouble with your vision. Get help right away if:  You get a very bad headache.  You start to feel confused.  You feel weak or numb.  You feel faint.  You get very bad pain in your: ? Chest. ? Belly  (abdomen).  You throw up (vomit) more than once.  You have trouble breathing. Summary  Hypertension is another name for high blood pressure.  Making healthy choices can help lower blood pressure. If your blood pressure cannot be controlled with healthy choices, you may need to take medicine. This information is not intended to replace advice given to you by your health care provider. Make sure you discuss any questions you have with your health care provider. Document Released: 12/25/2007 Document Revised: 06/05/2016 Document Reviewed: 06/05/2016 Elsevier Interactive Patient Education  [employer]2018 Elsevier Inc.
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
## 2017-05-31 LAB — VITAMIN B12: Vitamin B-12: 797 pg/mL (ref 232–1245)

## 2017-05-31 LAB — COMPREHENSIVE METABOLIC PANEL
A/G RATIO: 1.8 (ref 1.2–2.2)
ALT: 29 IU/L (ref 0–44)
AST: 26 IU/L (ref 0–40)
Albumin: 4.6 g/dL (ref 3.5–5.5)
Alkaline Phosphatase: 68 IU/L (ref 39–117)
BILIRUBIN TOTAL: 0.3 mg/dL (ref 0.0–1.2)
BUN/Creatinine Ratio: 14 (ref 9–20)
BUN: 14 mg/dL (ref 6–24)
CHLORIDE: 103 mmol/L (ref 96–106)
CO2: 23 mmol/L (ref 20–29)
Calcium: 9.6 mg/dL (ref 8.7–10.2)
Creatinine, Ser: 1.01 mg/dL (ref 0.76–1.27)
GFR calc Af Amer: 94 mL/min/{1.73_m2} (ref >59)
GFR, EST NON AFRICAN AMERICAN: 82 mL/min/{1.73_m2} (ref >59)
Globulin, Total: 2.6 g/dL (ref 1.5–4.5)
Glucose: 91 mg/dL (ref 65–99)
Potassium: 4.7 mmol/L (ref 3.5–5.2)
SODIUM: 142 mmol/L (ref 134–144)
Total Protein: 7.2 g/dL (ref 6.0–8.5)

## 2017-05-31 LAB — CBC
Hematocrit: 47.8 % (ref 37.5–51.0)
Hemoglobin: 16.2 g/dL (ref 13.0–17.7)
MCH: 31.3 pg (ref 26.6–33.0)
MCHC: 33.9 g/dL (ref 31.5–35.7)
MCV: 92 fL (ref 79–97)
Platelets: 314 10*3/uL (ref 150–379)
RBC: 5.18 x10E6/uL (ref 4.14–5.80)
RDW: 13.6 % (ref 12.3–15.4)
WBC: 5.7 10*3/uL (ref 3.4–10.8)

## 2017-05-31 LAB — LIPID PANEL
Chol/HDL Ratio: 4.2 ratio (ref 0.0–5.0)
Cholesterol, Total: 254 mg/dL — ABNORMAL HIGH (ref 100–199)
HDL: 60 mg/dL (ref >39)
LDL Calculated: 177 mg/dL — ABNORMAL HIGH (ref 0–99)
Triglycerides: 85 mg/dL (ref 0–149)
VLDL Cholesterol Cal: 17 mg/dL (ref 5–40)

## 2017-06-05 ENCOUNTER — Telehealth: Payer: Self-pay

## 2017-06-05 NOTE — Telephone Encounter (Signed)
Patient informed on lab result. Patient is aware to try low diet and exercise.  Patient verified DOB.

## 2017-06-05 NOTE — Telephone Encounter (Signed)
-----   Message from Claiborne RiggZelda W Fleming, NP sent at 06/05/2017  8:35 AM EST ----- Please call patient: Tests show increased cholesterol/lipid levels. At this time as these were not fasting labs I would recommend you should continue to work on low fat, heart healthy diet and participate in regular aerobic exercise program to control as well at least 150 minutes per week.

## 2017-06-09 ENCOUNTER — Ambulatory Visit: Payer: 59 | Attending: Nurse Practitioner

## 2017-06-23 ENCOUNTER — Ambulatory Visit: Payer: Self-pay | Attending: Nurse Practitioner | Admitting: Nurse Practitioner

## 2017-06-23 ENCOUNTER — Encounter: Payer: Self-pay | Admitting: Nurse Practitioner

## 2017-06-23 VITALS — BP 132/79 | HR 74 | Temp 98.8°F | Ht 68.0 in | Wt 184.0 lb

## 2017-06-23 DIAGNOSIS — E7849 Other hyperlipidemia: Secondary | ICD-10-CM | POA: Insufficient documentation

## 2017-06-23 DIAGNOSIS — I10 Essential (primary) hypertension: Secondary | ICD-10-CM | POA: Insufficient documentation

## 2017-06-23 DIAGNOSIS — N529 Male erectile dysfunction, unspecified: Secondary | ICD-10-CM | POA: Insufficient documentation

## 2017-06-23 DIAGNOSIS — Z888 Allergy status to other drugs, medicaments and biological substances status: Secondary | ICD-10-CM | POA: Insufficient documentation

## 2017-06-23 DIAGNOSIS — Z79899 Other long term (current) drug therapy: Secondary | ICD-10-CM | POA: Insufficient documentation

## 2017-06-23 DIAGNOSIS — R109 Unspecified abdominal pain: Secondary | ICD-10-CM | POA: Insufficient documentation

## 2017-06-23 DIAGNOSIS — F17200 Nicotine dependence, unspecified, uncomplicated: Secondary | ICD-10-CM

## 2017-06-23 DIAGNOSIS — K219 Gastro-esophageal reflux disease without esophagitis: Secondary | ICD-10-CM | POA: Insufficient documentation

## 2017-06-23 DIAGNOSIS — Z9889 Other specified postprocedural states: Secondary | ICD-10-CM | POA: Insufficient documentation

## 2017-06-23 DIAGNOSIS — Z833 Family history of diabetes mellitus: Secondary | ICD-10-CM | POA: Insufficient documentation

## 2017-06-23 DIAGNOSIS — Z1211 Encounter for screening for malignant neoplasm of colon: Secondary | ICD-10-CM | POA: Insufficient documentation

## 2017-06-23 MED ORDER — VARENICLINE TARTRATE 0.5 MG X 11 & 1 MG X 42 PO MISC: ORAL | 0 refills | Status: DC

## 2017-06-23 MED ORDER — ATORVASTATIN CALCIUM 20 MG PO TABS: 20.0000 mg | ORAL_TABLET | Freq: Every day | ORAL | 3 refills | Status: DC

## 2017-06-23 MED FILL — HYDROCHLOROTHIAZIDE 25 MG T: 25 | 30 days supply | Qty: 30 | Fill #1

## 2017-06-23 MED FILL — ?ATORVASTATIN 20 MG TABLET: 20 | 30 days supply | Qty: 30 | Fill #0
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
## 2017-06-26 ENCOUNTER — Ambulatory Visit: Payer: Self-pay | Attending: Nurse Practitioner

## 2017-07-28 MED FILL — ?ATORVASTATIN 20 MG TABLET: 20 | 30 days supply | Qty: 30 | Fill #1

## 2017-07-28 MED FILL — HYDROCHLOROTHIAZIDE 25 MG T: 25 | 30 days supply | Qty: 30 | Fill #2

## 2017-07-29 ENCOUNTER — Encounter: Payer: Self-pay | Admitting: Nurse Practitioner

## 2017-07-29 ENCOUNTER — Ambulatory Visit: Payer: Self-pay | Attending: Nurse Practitioner | Admitting: Nurse Practitioner

## 2017-07-29 VITALS — BP 135/86 | HR 85 | Temp 98.3°F | Ht 68.0 in | Wt 184.4 lb

## 2017-07-29 DIAGNOSIS — J029 Acute pharyngitis, unspecified: Secondary | ICD-10-CM | POA: Insufficient documentation

## 2017-07-29 DIAGNOSIS — Z79899 Other long term (current) drug therapy: Secondary | ICD-10-CM | POA: Insufficient documentation

## 2017-07-29 DIAGNOSIS — I10 Essential (primary) hypertension: Secondary | ICD-10-CM | POA: Insufficient documentation

## 2017-07-29 DIAGNOSIS — K219 Gastro-esophageal reflux disease without esophagitis: Secondary | ICD-10-CM | POA: Insufficient documentation

## 2017-07-29 LAB — POCT RAPID STREP A (OFFICE): Rapid Strep A Screen: NEGATIVE
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
## 2017-07-29 NOTE — Patient Instructions (Addendum)
Pharyngitis Pharyngitis is a sore throat (pharynx). There is redness, pain, and swelling of your throat. Follow these instructions at home:  Drink enough fluids to keep your pee (urine) clear or pale yellow.  Only take medicine as told by your doctor. ? You may get sick again if you do not take medicine as told. Finish your medicines, even if you start to feel better. ? Do not take aspirin.  Rest.  Rinse your mouth (gargle) with salt water ( tsp of salt per 1 qt of water) every 1-2 hours. This will help the pain.  If you are not at risk for choking, you can suck on hard candy or sore throat lozenges. Contact a doctor if:  You have large, tender lumps on your neck.  You have a rash.  You cough up green, yellow-brown, or bloody spit. Get help right away if:  You have a stiff neck.  You drool or cannot swallow liquids.  You throw up (vomit) or are not able to keep medicine or liquids down.  You have very bad pain that does not go away with medicine.  You have problems breathing (not from a stuffy nose). This information is not intended to replace advice given to you by your health care provider. Make sure you discuss any questions you have with your health care provider. Document Released: 12/25/2007 Document Revised: 12/14/2015 Document Reviewed: 03/15/2013 Elsevier Interactive Patient Education  2017 Else[employer] Inc.  Sore Throat When you have a sore throat, your throat may:  Hurt.  Burn.  Feel irritated.  Feel scratchy.  Many things can cause a sore throat, including:  An infection.  Allergies.  Dryness in the air.  Smoke or pollution.  Gastroesophageal reflux disease (GERD).  A tumor.  A sore throat can be the first sign of another sickness. It can happen with other problems, like coughing or a fever. Most sore throats go away without treatment. Follow these instructions at home:  Take over-the-counter medicines only as told by your doctor.  Drink  enough fluids to keep your pee (urine) clear or pale yellow.  Rest when you feel you need to.  To help with pain, try: ? Sipping warm liquids, such as broth, herbal tea, or warm water. ? Eating or drinking cold or frozen liquids, such as frozen ice pops. ? Gargling with a salt-water mixture 3-4 times a day or as needed. To make a salt-water mixture, add -1 tsp of salt in 1 cup of warm water. Mix it until you cannot see the salt anymore. ? Sucking on hard candy or throat lozenges. ? Putting a cool-mist humidifier in your bedroom at night. ? Sitting in the bathroom with the door closed for 5-10 minutes while you run hot water in the shower.  Do not use any tobacco products, such as cigarettes, chewing tobacco, and e-cigarettes. If you need help quitting, ask your doctor. Contact a doctor if:  You have a fever for more than 2-3 days.  You keep having symptoms for more than 2-3 days.  Your throat does not get better in 7 days.  You have a fever and your symptoms suddenly get worse. Get help right away if:  You have trouble breathing.  You cannot swallow fluids, soft foods, or your saliva.  You have swelling in your throat or neck that gets worse.  You keep feeling like you are going to throw up (vomit).  You keep throwing up. This information is not intended to replace advice given to  you by your health care provider. Make sure you discuss any questions you have with your health care provider. Document Released: 04/16/2008 Document Revised: 03/03/2016 Document Reviewed: 04/28/2015 Elsevier Interactive Patient Education  [employer].

## 2017-07-29 NOTE — Progress Notes (Signed)
po

## 2017-09-03 MED FILL — ATORVASTATIN 20 MG TABLET: 20 | 30 days supply | Qty: 30 | Fill #2

## 2017-09-03 MED FILL — HYDROCHLOROTHIAZIDE 25 MG T: 25 | 30 days supply | Qty: 30 | Fill #3

## 2017-09-23 ENCOUNTER — Ambulatory Visit: Payer: Self-pay | Attending: Nurse Practitioner | Admitting: Nurse Practitioner

## 2017-09-23 ENCOUNTER — Encounter: Payer: Self-pay | Admitting: Nurse Practitioner

## 2017-09-23 VITALS — BP 132/83 | HR 80 | Temp 98.9°F | Ht 68.0 in | Wt 184.2 lb

## 2017-09-23 DIAGNOSIS — K219 Gastro-esophageal reflux disease without esophagitis: Secondary | ICD-10-CM | POA: Insufficient documentation

## 2017-09-23 DIAGNOSIS — E782 Mixed hyperlipidemia: Secondary | ICD-10-CM

## 2017-09-23 DIAGNOSIS — M25572 Pain in left ankle and joints of left foot: Secondary | ICD-10-CM

## 2017-09-23 DIAGNOSIS — Z833 Family history of diabetes mellitus: Secondary | ICD-10-CM | POA: Insufficient documentation

## 2017-09-23 DIAGNOSIS — I10 Essential (primary) hypertension: Secondary | ICD-10-CM

## 2017-09-23 DIAGNOSIS — Z79899 Other long term (current) drug therapy: Secondary | ICD-10-CM | POA: Insufficient documentation

## 2017-09-23 DIAGNOSIS — F17200 Nicotine dependence, unspecified, uncomplicated: Secondary | ICD-10-CM

## 2017-09-23 DIAGNOSIS — Z888 Allergy status to other drugs, medicaments and biological substances status: Secondary | ICD-10-CM | POA: Insufficient documentation

## 2017-09-23 DIAGNOSIS — F17210 Nicotine dependence, cigarettes, uncomplicated: Secondary | ICD-10-CM | POA: Insufficient documentation

## 2017-09-23 MED ORDER — VARENICLINE TARTRATE 0.5 MG X 11 & 1 MG X 42 PO MISC: ORAL | 0 refills | Status: DC

## 2017-09-23 MED ORDER — ATORVASTATIN CALCIUM 20 MG PO TABS: 20.0000 mg | ORAL_TABLET | Freq: Every day | ORAL | 3 refills | Status: DC

## 2017-09-23 MED ORDER — HYDROCHLOROTHIAZIDE 25 MG PO TABS: 25.0000 mg | ORAL_TABLET | Freq: Every day | ORAL | 3 refills | Status: DC

## 2017-09-23 MED FILL — HYDROCHLOROTHIAZIDE 25 MG T: 25 | 30 days supply | Qty: 30 | Fill #0

## 2017-09-23 MED FILL — ATORVASTATIN 20 MG TABLET: 20 | 30 days supply | Qty: 30 | Fill #0

## 2017-09-23 NOTE — Patient Instructions (Addendum)
Ankle Sprain  An ankle sprain is a stretch or tear in one of the tough tissues (ligaments) in your ankle.  Follow these instructions at home:   Rest your ankle.   Take over-the-counter and prescription medicines only as told by your doctor.   For 2-3 days, keep your ankle higher than the level of your heart (elevated) as much as possible.   If directed, put ice on the area:  ? Put ice in a plastic bag.  ? Place a towel between your skin and the bag.  ? Leave the ice on for 20 minutes, 2-3 times a day.   If you were given a brace:  ? Wear it as told.  ? Take it off to shower or bathe.  ? Try not to move your ankle much, but wiggle your toes from time to time. This helps to prevent swelling.   If you were given an elastic bandage (dressing):  ? Take it off when you shower or bathe.  ? Try not to move your ankle much, but wiggle your toes from time to time. This helps to prevent swelling.  ? Adjust the bandage to make it more comfortable if it feels too tight.  ? Loosen the bandage if you lose feeling in your foot, your foot tingles, or your foot gets cold and blue.   If you have crutches, use them as told by your doctor. Continue to use them until you can walk without feeling pain in your ankle.  Contact a doctor if:   Your bruises or swelling are quickly getting worse.   Your pain does not get better after you take medicine.  Get help right away if:   You cannot feel your toes or foot.   Your toes or your foot looks blue.   You have very bad pain that gets worse.  This information is not intended to replace advice given to you by your health care provider. Make sure you discuss any questions you have with your health care provider.  Document Released: 12/25/2007 Document Revised: 12/14/2015 Document Reviewed: 02/07/2015  Elsevier Interactive Patient Education  2018 Elsevier Inc.
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
## 2017-10-17 ENCOUNTER — Other Ambulatory Visit: Payer: Self-pay

## 2017-10-17 ENCOUNTER — Emergency Department (HOSPITAL_COMMUNITY)
Admission: EM | Admit: 2017-10-17 | Discharge: 2017-10-17 | Disposition: A | Discharge status: Home / Self Care | Payer: Self-pay | Attending: Emergency Medicine | Admitting: Emergency Medicine

## 2017-10-17 ENCOUNTER — Encounter (HOSPITAL_COMMUNITY): Payer: Self-pay

## 2017-10-17 ENCOUNTER — Emergency Department (HOSPITAL_COMMUNITY): Payer: Self-pay

## 2017-10-17 DIAGNOSIS — W312XXA Contact with powered woodworking and forming machines, initial encounter: Secondary | ICD-10-CM | POA: Insufficient documentation

## 2017-10-17 DIAGNOSIS — Z23 Encounter for immunization: Secondary | ICD-10-CM | POA: Insufficient documentation

## 2017-10-17 DIAGNOSIS — S61313A Laceration without foreign body of left middle finger with damage to nail, initial encounter: Secondary | ICD-10-CM

## 2017-10-17 DIAGNOSIS — Z79899 Other long term (current) drug therapy: Secondary | ICD-10-CM | POA: Insufficient documentation

## 2017-10-17 DIAGNOSIS — S61213A Laceration without foreign body of left middle finger without damage to nail, initial encounter: Secondary | ICD-10-CM | POA: Insufficient documentation

## 2017-10-17 DIAGNOSIS — Y9389 Activity, other specified: Secondary | ICD-10-CM | POA: Insufficient documentation

## 2017-10-17 DIAGNOSIS — I10 Essential (primary) hypertension: Secondary | ICD-10-CM | POA: Insufficient documentation

## 2017-10-17 DIAGNOSIS — Y999 Unspecified external cause status: Secondary | ICD-10-CM | POA: Insufficient documentation

## 2017-10-17 DIAGNOSIS — F17210 Nicotine dependence, cigarettes, uncomplicated: Secondary | ICD-10-CM | POA: Insufficient documentation

## 2017-10-17 DIAGNOSIS — Y929 Unspecified place or not applicable: Secondary | ICD-10-CM | POA: Insufficient documentation

## 2017-10-17 MED ORDER — TETANUS-DIPHTH-ACELL PERTUSSIS 5-2.5-18.5 LF-MCG/0.5 IM SUSP
0.5000 mL | Freq: Once | INTRAMUSCULAR | Status: AC
  Administered 2017-10-17: 0.5 mL via INTRAMUSCULAR
  Filled 2017-10-17: qty 0.5

## 2017-10-17 MED ORDER — CEPHALEXIN 500 MG PO CAPS: 500.0000 mg | ORAL_CAPSULE | Freq: Four times a day (QID) | ORAL | 0 refills | Status: DC

## 2017-10-17 MED ORDER — HYDROCODONE-ACETAMINOPHEN 5-325 MG PO TABS: 1.0000 | ORAL_TABLET | Freq: Four times a day (QID) | ORAL | 0 refills | Status: DC | PRN

## 2017-10-17 MED ORDER — LIDOCAINE HCL (PF) 1 % IJ SOLN
10.0000 mL | Freq: Once | INTRAMUSCULAR | Status: DC
  Filled 2017-10-17: qty 10

## 2017-10-17 MED ORDER — LIDOCAINE-EPINEPHRINE-TETRACAINE (LET) SOLUTION
3.0000 mL | Freq: Once | NASAL | Status: AC
  Administered 2017-10-17: 17:00:00 3 mL via TOPICAL
  Filled 2017-10-17: qty 3
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
## 2017-10-17 NOTE — Discharge Instructions (Signed)
Please take Ibuprofen (Advil, motrin) and Tylenol (acetaminophen) to relieve your pain.  You may take up to 600 MG (3 pills) of normal strength ibuprofen every 8 hours as needed.  In between doses of ibuprofen you make take tylenol, up to 1,000 mg (two extra strength pills).  Do not take more than 3,000 mg tylenol in a 24 hour period.  Please check all medication labels as many medications such as pain and cold medications may contain tylenol.  Do not drink alcohol while taking these medications.  Do not take other NSAID'S while taking ibuprofen (such as aleve or naproxen).  Please take ibuprofen with food to decrease stomach upset. You may have diarrhea from the antibiotics.  It is very important that you continue to take the antibiotics even if you get diarrhea unless a medical professional tells you that you may stop taking them.  If you stop too early the bacteria you are being treated for will become stronger and you may need different, more powerful antibiotics that have more side effects and worsening diarrhea.  Please stay well hydrated and consider probiotics as they may decrease the severity of your diarrhea.   You are being prescribed a medication which may make you sleepy. For 24 hours after one dose please do not drive, operate heavy machinery, care for a small child with out another adult present, or perform any activities that may cause harm to you or someone else if you were to fall asleep or be impaired.   

## 2017-10-17 NOTE — ED Triage Notes (Signed)
Pt states he cut his left middle finger on a table saw. Pt here to have stitches. Does not think he has bone involvement. Bleeding controlled in triage. Last tetanus approximately 8 years ago.

## 2017-12-01 MED FILL — HYDROCHLOROTHIAZIDE 25 MG T: 25 | 30 days supply | Qty: 30 | Fill #1

## 2017-12-01 MED FILL — ATORVASTATIN 20 MG TABLET: 20 | 30 days supply | Qty: 30 | Fill #1

## 2017-12-10 ENCOUNTER — Other Ambulatory Visit: Payer: Self-pay | Admitting: Nurse Practitioner

## 2017-12-10 MED ORDER — VARENICLINE TARTRATE 1 MG PO TABS: 1.0000 mg | ORAL_TABLET | Freq: Two times a day (BID) | ORAL | 1 refills | Status: DC

## 2017-12-10 MED FILL — $CHANTIX 1 MG CONT MONTH BO: 1 | 56 days supply | Qty: 112 | Fill #0

## 2017-12-10 MED FILL — $CHANTIX STARTING MONTH BOX: 0.5 MG X 11 | 53 days supply | Qty: 53 | Fill #0

## 2017-12-26 ENCOUNTER — Ambulatory Visit: Payer: Self-pay | Attending: Nurse Practitioner | Admitting: Nurse Practitioner

## 2017-12-26 ENCOUNTER — Encounter: Payer: Self-pay | Admitting: Nurse Practitioner

## 2017-12-26 VITALS — BP 146/84 | HR 77 | Temp 98.6°F | Resp 18 | Ht 68.5 in | Wt 176.0 lb

## 2017-12-26 DIAGNOSIS — M25572 Pain in left ankle and joints of left foot: Secondary | ICD-10-CM | POA: Insufficient documentation

## 2017-12-26 DIAGNOSIS — Z0000 Encounter for general adult medical examination without abnormal findings: Secondary | ICD-10-CM

## 2017-12-26 DIAGNOSIS — Z79899 Other long term (current) drug therapy: Secondary | ICD-10-CM | POA: Insufficient documentation

## 2017-12-26 DIAGNOSIS — K219 Gastro-esophageal reflux disease without esophagitis: Secondary | ICD-10-CM | POA: Insufficient documentation

## 2017-12-26 DIAGNOSIS — Z888 Allergy status to other drugs, medicaments and biological substances status: Secondary | ICD-10-CM | POA: Insufficient documentation

## 2017-12-26 DIAGNOSIS — Z0001 Encounter for general adult medical examination with abnormal findings: Secondary | ICD-10-CM | POA: Insufficient documentation

## 2017-12-26 DIAGNOSIS — Z833 Family history of diabetes mellitus: Secondary | ICD-10-CM | POA: Insufficient documentation

## 2017-12-26 DIAGNOSIS — G8929 Other chronic pain: Secondary | ICD-10-CM | POA: Insufficient documentation

## 2017-12-26 DIAGNOSIS — I10 Essential (primary) hypertension: Secondary | ICD-10-CM | POA: Insufficient documentation
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
## 2017-12-29 ENCOUNTER — Ambulatory Visit (HOSPITAL_COMMUNITY)
Admission: RE | Admit: 2017-12-29 | Discharge: 2017-12-29 | Disposition: A | Discharge status: Home / Self Care | Payer: Self-pay | Source: Ambulatory Visit | Attending: Nurse Practitioner | Admitting: Nurse Practitioner

## 2017-12-29 DIAGNOSIS — M84472G Pathological fracture, left ankle, subsequent encounter for fracture with delayed healing: Secondary | ICD-10-CM | POA: Insufficient documentation

## 2017-12-29 DIAGNOSIS — M25572 Pain in left ankle and joints of left foot: Secondary | ICD-10-CM

## 2017-12-29 DIAGNOSIS — G8929 Other chronic pain: Secondary | ICD-10-CM | POA: Insufficient documentation

## 2017-12-30 ENCOUNTER — Telehealth: Payer: Self-pay

## 2017-12-30 NOTE — Telephone Encounter (Signed)
-----   Message from Claiborne RiggZelda W Fleming, NP sent at 12/30/2017  1:37 PM EDT ----- Herby AbrahamXray showing small avulsion fracture of the medial ankle. This happens when you have an injury to a ligament and it tears away a small piece of bone with it.  The best treatment for ankle avulsion fractures are rest and icing. Keep weight off the ankle until it has healed, and take measures to reduce swelling by elevating the ankle and applying ice. When icing an injury, use an ice pack or ice wrapped in a towel. Will need to be referred to ortho as well. Please let us know as soon as you have renewed your [facility] discount so that I can refer.

## 2017-12-30 NOTE — Telephone Encounter (Signed)
CMA spoke to patient to inform on ankle X-ray results and advising from PCP.  Patient understood. Pt. Was inform to renew his [facility] discount [facility]

## 2018-01-15 MED FILL — HYDROCHLOROTHIAZIDE 25 MG T: 25 | 30 days supply | Qty: 30 | Fill #2

## 2018-01-15 MED FILL — ATORVASTATIN 20 MG TABLET: 20 | 30 days supply | Qty: 30 | Fill #2

## 2018-01-16 ENCOUNTER — Ambulatory Visit: Payer: Self-pay | Attending: Nurse Practitioner

## 2018-02-13 MED FILL — ATORVASTATIN 20 MG TABLET: 20 | 30 days supply | Qty: 30 | Fill #3

## 2018-02-13 MED FILL — HYDROCHLOROTHIAZIDE 25 MG T: 25 | 30 days supply | Qty: 30 | Fill #3

## 2018-03-30 ENCOUNTER — Ambulatory Visit: Payer: Self-pay | Admitting: Nurse Practitioner

## 2018-10-18 ENCOUNTER — Emergency Department (HOSPITAL_COMMUNITY)
Admission: EM | Admit: 2018-10-18 | Discharge: 2018-10-18 | Disposition: A | Discharge status: Home / Self Care | Payer: Self-pay | Attending: Emergency Medicine | Admitting: Emergency Medicine

## 2018-10-18 ENCOUNTER — Emergency Department (HOSPITAL_COMMUNITY): Payer: Self-pay

## 2018-10-18 ENCOUNTER — Other Ambulatory Visit: Payer: Self-pay

## 2018-10-18 ENCOUNTER — Encounter (HOSPITAL_COMMUNITY): Payer: Self-pay | Admitting: *Deleted

## 2018-10-18 DIAGNOSIS — Z79899 Other long term (current) drug therapy: Secondary | ICD-10-CM | POA: Insufficient documentation

## 2018-10-18 DIAGNOSIS — E782 Mixed hyperlipidemia: Secondary | ICD-10-CM | POA: Insufficient documentation

## 2018-10-18 DIAGNOSIS — I10 Essential (primary) hypertension: Secondary | ICD-10-CM | POA: Insufficient documentation

## 2018-10-18 DIAGNOSIS — Y939 Activity, unspecified: Secondary | ICD-10-CM | POA: Insufficient documentation

## 2018-10-18 DIAGNOSIS — F17200 Nicotine dependence, unspecified, uncomplicated: Secondary | ICD-10-CM | POA: Insufficient documentation

## 2018-10-18 DIAGNOSIS — M7022 Olecranon bursitis, left elbow: Secondary | ICD-10-CM | POA: Insufficient documentation
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
## 2018-10-18 NOTE — ED Notes (Signed)
Patient transported to X-ray 
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
## 2018-10-18 NOTE — ED Notes (Signed)
Patient verbalizes understanding of discharge instructions . Opportunity for questions and answers were provided . Armband removed by staff ,Pt discharged from ED. W/C  offered at D/C  and Declined W/C at D/C and was escorted to lobby by RN.  
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
## 2021-01-12 ENCOUNTER — Other Ambulatory Visit: Payer: Self-pay

## 2021-01-12 ENCOUNTER — Ambulatory Visit: Payer: Self-pay | Attending: Nurse Practitioner | Admitting: Nurse Practitioner

## 2021-01-12 ENCOUNTER — Encounter: Payer: Self-pay | Admitting: Nurse Practitioner

## 2021-01-12 DIAGNOSIS — Z1329 Encounter for screening for other suspected endocrine disorder: Secondary | ICD-10-CM

## 2021-01-12 DIAGNOSIS — Z114 Encounter for screening for human immunodeficiency virus [HIV]: Secondary | ICD-10-CM

## 2021-01-12 DIAGNOSIS — Z130 Encounter for screening for diseases of the blood and blood-forming organs and certain disorders involving the immune mechanism: Secondary | ICD-10-CM

## 2021-01-12 DIAGNOSIS — Z125 Encounter for screening for malignant neoplasm of prostate: Secondary | ICD-10-CM

## 2021-01-12 DIAGNOSIS — Z13220 Encounter for screening for lipoid disorders: Secondary | ICD-10-CM

## 2021-01-12 DIAGNOSIS — Z1159 Encounter for screening for other viral diseases: Secondary | ICD-10-CM

## 2021-01-12 DIAGNOSIS — I10 Essential (primary) hypertension: Secondary | ICD-10-CM

## 2021-01-12 DIAGNOSIS — E782 Mixed hyperlipidemia: Secondary | ICD-10-CM

## 2021-01-12 MED ORDER — HYDROCHLOROTHIAZIDE 25 MG PO TABS
25.0000 mg | ORAL_TABLET | Freq: Every day | ORAL | 0 refills | Status: DC
  Filled 2021-01-12: qty 90, 90d supply, fill #0

## 2021-01-12 MED ORDER — HYDROCHLOROTHIAZIDE 25 MG PO TABS: 25.0000 mg | ORAL_TABLET | Freq: Every day | ORAL | 3 refills | Status: DC

## 2021-01-12 MED ORDER — HYDROCHLOROTHIAZIDE 25 MG PO TABS
25.0000 mg | ORAL_TABLET | Freq: Every day | ORAL | 0 refills | Status: DC
  Filled 2021-01-12: qty 30, 30d supply, fill #0
  Filled 2021-02-28: qty 30, 30d supply, fill #1
  Filled 2021-04-04: qty 30, 30d supply, fill #2

## 2021-01-12 MED ORDER — ATORVASTATIN CALCIUM 20 MG PO TABS
20.0000 mg | ORAL_TABLET | Freq: Every day | ORAL | 3 refills | Status: DC
  Filled 2021-01-12: qty 30, 30d supply, fill #0
  Filled 2021-02-28: qty 30, 30d supply, fill #1
  Filled 2021-04-04: qty 30, 30d supply, fill #2

## 2021-01-12 MED ORDER — ATORVASTATIN CALCIUM 20 MG PO TABS: 20.0000 mg | ORAL_TABLET | Freq: Every day | ORAL | 3 refills | Status: DC
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
## 2021-01-13 LAB — CBC
Hematocrit: 42.6 % (ref 37.5–51.0)
Hemoglobin: 14.7 g/dL (ref 13.0–17.7)
MCH: 31.1 pg (ref 26.6–33.0)
MCHC: 34.5 g/dL (ref 31.5–35.7)
MCV: 90 fL (ref 79–97)
Platelets: 343 10*3/uL (ref 150–450)
RBC: 4.72 x10E6/uL (ref 4.14–5.80)
RDW: 12.9 % (ref 11.6–15.4)
WBC: 7.8 10*3/uL (ref 3.4–10.8)

## 2021-01-13 LAB — CMP14+EGFR
ALT: 31 IU/L (ref 0–44)
AST: 30 IU/L (ref 0–40)
Albumin/Globulin Ratio: 1.8 (ref 1.2–2.2)
Albumin: 4.8 g/dL (ref 3.8–4.8)
Alkaline Phosphatase: 70 IU/L (ref 44–121)
BUN/Creatinine Ratio: 15 (ref 10–24)
BUN: 16 mg/dL (ref 8–27)
Bilirubin Total: 0.3 mg/dL (ref 0.0–1.2)
CO2: 22 mmol/L (ref 20–29)
Calcium: 9.4 mg/dL (ref 8.6–10.2)
Chloride: 102 mmol/L (ref 96–106)
Creatinine, Ser: 1.04 mg/dL (ref 0.76–1.27)
Globulin, Total: 2.6 g/dL (ref 1.5–4.5)
Glucose: 88 mg/dL (ref 65–99)
Potassium: 4.2 mmol/L (ref 3.5–5.2)
Sodium: 140 mmol/L (ref 134–144)
Total Protein: 7.4 g/dL (ref 6.0–8.5)
eGFR: 82 mL/min/{1.73_m2} (ref >59)

## 2021-01-13 LAB — PSA: Prostate Specific Ag, Serum: 2.7 ng/mL (ref 0.0–4.0)

## 2021-01-13 LAB — LIPID PANEL
Chol/HDL Ratio: 4.9 ratio (ref 0.0–5.0)
Cholesterol, Total: 259 mg/dL — ABNORMAL HIGH (ref 100–199)
HDL: 53 mg/dL (ref >39)
LDL Chol Calc (NIH): 144 mg/dL — ABNORMAL HIGH (ref 0–99)
Triglycerides: 338 mg/dL — ABNORMAL HIGH (ref 0–149)
VLDL Cholesterol Cal: 62 mg/dL — ABNORMAL HIGH (ref 5–40)

## 2021-01-13 LAB — HCV INTERPRETATION

## 2021-01-13 LAB — HIV ANTIBODY (ROUTINE TESTING W REFLEX): HIV Screen 4th Generation wRfx: NONREACTIVE

## 2021-01-13 LAB — TSH: TSH: 0.723 u[IU]/mL (ref 0.450–4.500)

## 2021-01-13 LAB — HCV AB W REFLEX TO QUANT PCR: HCV Ab: <0.1 s/co ratio (ref 0.0–0.9)

## 2021-02-28 ENCOUNTER — Other Ambulatory Visit: Payer: Self-pay

## 2021-04-04 ENCOUNTER — Other Ambulatory Visit: Payer: Self-pay

## 2021-04-04 ENCOUNTER — Encounter: Payer: Self-pay | Admitting: Nurse Practitioner

## 2021-04-04 ENCOUNTER — Ambulatory Visit: Payer: Self-pay | Attending: Nurse Practitioner | Admitting: Nurse Practitioner

## 2021-04-04 VITALS — BP 138/68 | HR 64 | Ht 68.5 in | Wt 189.0 lb

## 2021-04-04 DIAGNOSIS — I10 Essential (primary) hypertension: Secondary | ICD-10-CM

## 2021-04-04 DIAGNOSIS — Z23 Encounter for immunization: Secondary | ICD-10-CM

## 2021-04-04 DIAGNOSIS — E782 Mixed hyperlipidemia: Secondary | ICD-10-CM

## 2021-04-04 MED ORDER — HYDROCHLOROTHIAZIDE 25 MG PO TABS
25.0000 mg | ORAL_TABLET | Freq: Every day | ORAL | 1 refills | Status: DC
  Filled 2021-04-04: qty 90, 90d supply, fill #0

## 2021-04-04 MED ORDER — ATORVASTATIN CALCIUM 20 MG PO TABS
20.0000 mg | ORAL_TABLET | Freq: Every day | ORAL | 3 refills | Status: DC
  Filled 2021-04-04: qty 90, 90d supply, fill #0
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
## 2021-04-05 ENCOUNTER — Other Ambulatory Visit: Payer: Self-pay | Admitting: Nurse Practitioner

## 2021-04-05 DIAGNOSIS — R7309 Other abnormal glucose: Secondary | ICD-10-CM

## 2021-04-05 LAB — BASIC METABOLIC PANEL
BUN/Creatinine Ratio: 13 (ref 10–24)
BUN: 14 mg/dL (ref 8–27)
CO2: 26 mmol/L (ref 20–29)
Calcium: 9.5 mg/dL (ref 8.6–10.2)
Chloride: 96 mmol/L (ref 96–106)
Creatinine, Ser: 1.08 mg/dL (ref 0.76–1.27)
Glucose: 161 mg/dL — ABNORMAL HIGH (ref 65–99)
Potassium: 4.2 mmol/L (ref 3.5–5.2)
Sodium: 139 mmol/L (ref 134–144)
eGFR: 78 mL/min/{1.73_m2} (ref >59)

## 2021-04-06 NOTE — Progress Notes (Signed)
Called the Pt and scheduled appointment to return.

## 2021-04-09 ENCOUNTER — Other Ambulatory Visit: Payer: Self-pay

## 2021-04-23 ENCOUNTER — Other Ambulatory Visit: Payer: Self-pay | Admitting: Nurse Practitioner

## 2021-04-23 ENCOUNTER — Ambulatory Visit: Payer: Self-pay | Attending: Nurse Practitioner

## 2021-04-23 ENCOUNTER — Other Ambulatory Visit: Payer: Self-pay

## 2021-04-23 DIAGNOSIS — K921 Melena: Secondary | ICD-10-CM

## 2021-04-23 DIAGNOSIS — R7309 Other abnormal glucose: Secondary | ICD-10-CM

## 2021-04-24 LAB — HEMOGLOBIN A1C
Est. average glucose Bld gHb Est-mCnc: 143 mg/dL
Hgb A1c MFr Bld: 6.6 % — ABNORMAL HIGH (ref 4.8–5.6)

## 2021-04-26 ENCOUNTER — Telehealth: Payer: Self-pay

## 2021-04-26 LAB — FECAL OCCULT BLOOD, IMMUNOCHEMICAL: Fecal Occult Bld: NEGATIVE

## 2021-04-26 NOTE — Telephone Encounter (Signed)
-----   Message from Claiborne Rigg, NP sent at 04/25/2021  9:19 PM EDT ----- A1c shows diabetes at the beginning stages. Needs to work on losing 10lbs and eating more fruits, vegetables, proteins. Less breads, rice, potatoes and foods high in carbohydrates. Will repeat in January and if higher than 7 will need to start medication.

## 2021-04-26 NOTE — Telephone Encounter (Signed)
-----  Message from [name], NP sent at 04/26/2021  9:38 AM EDT ----- Negative stool kit test. Repeat in 1 year

## 2021-07-06 ENCOUNTER — Ambulatory Visit: Payer: Self-pay | Admitting: Nurse Practitioner

## 2021-07-07 ENCOUNTER — Encounter (HOSPITAL_COMMUNITY): Payer: Self-pay | Admitting: *Deleted

## 2021-07-07 ENCOUNTER — Other Ambulatory Visit: Payer: Self-pay

## 2021-07-07 ENCOUNTER — Emergency Department (HOSPITAL_COMMUNITY)
Admission: EM | Admit: 2021-07-07 | Discharge: 2021-07-07 | Disposition: A | Discharge status: Home / Self Care | Payer: Self-pay | Attending: Emergency Medicine | Admitting: Emergency Medicine

## 2021-07-07 DIAGNOSIS — Z87891 Personal history of nicotine dependence: Secondary | ICD-10-CM | POA: Insufficient documentation

## 2021-07-07 DIAGNOSIS — I10 Essential (primary) hypertension: Secondary | ICD-10-CM | POA: Insufficient documentation

## 2021-07-07 DIAGNOSIS — H6123 Impacted cerumen, bilateral: Secondary | ICD-10-CM | POA: Insufficient documentation

## 2021-07-07 NOTE — ED Provider Notes (Signed)
Emergency Medicine Provider Triage Evaluation Note  Larry Green , a 60 y.o. male  was evaluated in triage.  Pt complains of right ear ringing.  Pt reports he feels weak and can not rest   Review of Systems  Positive: Weakness and nausea  Negative: cough  Physical Exam  BP 133/84    Pulse 75    Temp 98.6 F (37 C) (Oral)    Resp 16    Ht 5\' 8"  (1.727 m)    Wt 85.7 kg    SpO2 100%    BMI 28.73 kg/m  Gen:   Awake, no distress   Resp:  Normal effort  MSK:   Moves extremities without difficulty  Other:  Bilt ear canals occluded with wax.   Medical Decision Making  Medically screening exam initiated at 8:04 PM.  Appropriate orders placed.  was informed that the remainder of the evaluation will be completed by another provider, this initial triage assessment does not replace that evaluation, and the importance of remaining in the ED until their evaluation is complete.     Jones Skene, Elson Areas 07/07/21 2005    07/09/21, MD 07/11/21 1326

## 2021-07-07 NOTE — ED Triage Notes (Signed)
The pt has a buzzing in his ert er for 2 days  he reports that the buzzing never stops
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
## 2021-07-09 ENCOUNTER — Ambulatory Visit: Payer: Self-pay | Admitting: *Deleted

## 2021-07-09 NOTE — Telephone Encounter (Signed)
"  Pts wife calling stating that the pt had ear wax removed on Saturday 07/07/21. She states that the pt is having pain, ringing in ears, and dizziness. Called to schedule an appt. Please advise."    Chief Complaint: "Right ear ringing" Symptoms: "Loud ringing" nausea, dizziness Frequency: Onset 07/05/21. Ringing constant, seen in ED 07/07/21, wax removed both ears  Pertinent Negatives: Patient denies pain, drainage, dizziness intermittent Disposition: [] ED /[x] Urgent Care (no appt availability in office) / [] Appointment(In office/virtual)/ []  [facility]/ [] Home Care/ [] Refused Recommended Disposition  Additional Notes:  Pt declined UC. Requesting "Something be called in like an antibiotic, need to do something, can't stand the ringing." Care advise given, pt verbalizes understanding. Assured pt NT would route to practice for PCPs review and final disposition.  Please advise: [phone]    Reason for Disposition  Patient sounds very sick or weak to the triager  Answer Assessment - Initial Assessment Questions 1. DESCRIPTION: "Describe the sound you are hearing." (e.g., hissing, humming, pounding, ringing)     Loud ringing 2. LOCATION: "One or both ears?" If one, ask: "Which ear?"     Right ear 3. SEVERITY: "How bad is it?"    - MILD - doesn't interfere with normal activities, only can hear in a quiet room    - MODERATE-SEVERE (Bothersome): interferes with work, school, sleep, or other activities      severe 4. ONSET: "When did this begin?" "Did it start suddenly or come on gradually?"     Friday, wax removed, back yesterday 5. PATTERN: "Does this come and go, or has it been constant since it started?"     Constant 6. HEARING LOSS: "Is your hearing decreased?" (e.g., normal, decreased)       Yes 7. OTHER SYMPTOMS: "Do you have any other symptoms?" (e.g., dizziness, earache)     Nausea, dizziness, weak  Protocols used: Tinnitus-A-AH

## 2021-07-09 NOTE — Telephone Encounter (Signed)
Scheduled apt at [facility], with Provider [facility]-  Patient aware of location and apt time.

## 2021-07-10 ENCOUNTER — Other Ambulatory Visit: Payer: Self-pay

## 2021-07-10 ENCOUNTER — Encounter: Payer: Self-pay | Admitting: Nurse Practitioner

## 2021-07-10 ENCOUNTER — Ambulatory Visit (INDEPENDENT_AMBULATORY_CARE_PROVIDER_SITE_OTHER): Payer: Self-pay | Admitting: Nurse Practitioner

## 2021-07-10 ENCOUNTER — Telehealth: Payer: Self-pay | Admitting: Nurse Practitioner

## 2021-07-10 DIAGNOSIS — H6121 Impacted cerumen, right ear: Secondary | ICD-10-CM | POA: Insufficient documentation

## 2021-07-10 DIAGNOSIS — H6690 Otitis media, unspecified, unspecified ear: Secondary | ICD-10-CM | POA: Insufficient documentation

## 2021-07-10 DIAGNOSIS — H9311 Tinnitus, right ear: Secondary | ICD-10-CM | POA: Insufficient documentation

## 2021-07-10 MED ORDER — AMOXICILLIN 875 MG PO TABS
875.0000 mg | ORAL_TABLET | Freq: Two times a day (BID) | ORAL | 0 refills | Status: AC
  Filled 2021-07-10: qty 20, 10d supply, fill #0

## 2021-07-10 MED ORDER — CIPROFLOXACIN-DEXAMETHASONE 0.3-0.1 % OT SUSP
4.0000 [drp] | Freq: Two times a day (BID) | OTIC | 0 refills | Status: AC
  Filled 2021-07-10: qty 2.8, 7d supply, fill #0

## 2021-07-10 MED ORDER — FLUTICASONE PROPIONATE 50 MCG/ACT NA SUSP
2.0000 | Freq: Every day | NASAL | 6 refills | Status: DC
  Filled 2021-07-10: qty 16, 30d supply, fill #0

## 2021-07-10 NOTE — Patient Instructions (Addendum)
Tinnitus right ear Impacted cerumen right ear Otitis media Sinusitis:  Will order ciprodex drops  Will order amoxicillin  Will order Flonase  Will place referral to ENT  Follow up:  Follow up with PCP if needed    Tinnitus Tinnitus refers to hearing a sound when there is no actual source for that sound. This is often described as ringing in the ears. However, people with this condition may hear a variety of noises, in one ear or in both ears. The sounds of tinnitus can be soft, loud, or somewhere in between. Tinnitus can last for a few seconds or can be constant for days. It may go away without treatment and come back at various times. When tinnitus is constant or happens often, it can lead to other problems, such as trouble sleeping and trouble concentrating. Almost everyone experiences tinnitus at some point. Tinnitus is not the same as hearing loss. Tinnitus that is long-lasting (chronic) or comes back often (recurs) may require medical attention. What are the causes? The cause of tinnitus is often not known. In some cases, it can result from: Exposure to loud noises from machinery, music, or other sources. An object (foreign body) stuck in the ear. Earwax buildup. Drinking alcohol or caffeine. Taking certain medicines. Age-related hearing loss. It may also be caused by medical conditions such as: Ear or sinus infections. Heart diseases or high blood pressure. Allergies. Mnire's disease. Thyroid problems. Tumors. A weak, bulging blood vessel (aneurysm) near the ear. What increases the risk? The following factors may make you more likely to develop this condition: Exposure to loud noises. Age. Tinnitus is more likely in older individuals. Using alcohol or tobacco. What are the signs or symptoms? The main symptom of tinnitus is hearing a sound when there is no source for that sound. It may sound like: Buzzing. Sizzling. Ringing. Blowing  air. Hissing. Whistling. Other sounds may include: Roaring. Running water. A musical note. Tapping. Humming. Symptoms may affect only one ear (unilateral) or both ears (bilateral). How is this diagnosed? Tinnitus is diagnosed based on your symptoms, your medical history, and a physical exam. Your health care provider may do a thorough hearing test (audiologic exam) if your tinnitus: Is unilateral. Causes hearing difficulties. Lasts 6 months or longer. You may work with a health care provider who specializes in hearing disorders (audiologist). You may be asked questions about your symptoms and how they affect your daily life. You may have other tests done, such as: CT scan. MRI. An imaging test of how blood flows through your blood vessels (angiogram). How is this treated? Treating an underlying medical condition can sometimes make tinnitus go away. If your tinnitus continues, other treatments may include: Therapy and counseling to help you manage the stress of living with tinnitus. Sound generators to mask the tinnitus. These include: Tabletop sound machines that play relaxing sounds to help you fall asleep. Wearable devices that fit in your ear and play sounds or music. Acoustic neural stimulation. This involves using headphones to listen to music that contains an auditory signal. Over time, listening to this signal may change some pathways in your brain and make you less sensitive to tinnitus. This treatment is used for very severe cases when no other treatment is working. Using hearing aids or cochlear implants if your tinnitus is related to hearing loss. Hearing aids are worn in the outer ear. Cochlear implants are surgically placed in the inner ear. Follow these instructions at home: Managing symptoms   When possible,  avoid being in loud places and being exposed to loud sounds. Wear hearing protection, such as earplugs, when you are exposed to loud noises. Use a white noise  machine, a humidifier, or other devices to mask the sound of tinnitus. Practice techniques for reducing stress, such as meditation, yoga, or deep breathing. Work with your health care provider if you need help with managing stress. Sleep with your head slightly raised. This may reduce the impact of tinnitus. General instructions Do not use stimulants, such as nicotine, alcohol, or caffeine. Talk with your health care provider about other stimulants to avoid. Stimulants are substances that can make you feel alert and attentive by increasing certain activities in the body (such as heart rate and blood pressure). These substances may make tinnitus worse. Take over-the-counter and prescription medicines only as told by your health care provider. Try to get plenty of sleep each night. Keep all follow-up visits. This is important. Contact a health care provider if: Your tinnitus continues for 3 weeks or longer without stopping. You develop sudden hearing loss. Your symptoms get worse or do not get better with home care. You feel you are not able to manage the stress of living with tinnitus. Get help right away if: You develop tinnitus after a head injury. You have tinnitus along with any of the following: Dizziness. Nausea and vomiting. Loss of balance. Sudden, severe headache. Vision changes. Facial weakness or weakness of arms or legs. These symptoms may represent a serious problem that is an emergency. Do not wait to see if the symptoms will go away. Get medical help right away. Call your local emergency services (911 in the [place] Do not drive yourself to the hospital. Summary Tinnitus refers to hearing a sound when there is no actual source for that sound. This is often described as ringing in the ears. Symptoms may affect only one ear (unilateral) or both ears (bilateral). Use a white noise machine, a humidifier, or other devices to mask the sound of tinnitus. Do not use stimulants, such  as nicotine, alcohol, or caffeine. These substances may make tinnitus worse. This information is not intended to replace advice given to you by your health care provider. Make sure you discuss any questions you have with your health care provider. Document Revised: 06/12/2020 Document Reviewed: 06/12/2020 Elsevier Patient Education  2022 [employer].

## 2021-07-10 NOTE — Assessment & Plan Note (Signed)
Impacted cerumen right ear Otitis media Sinusitis:  Will order ciprodex drops  Will order amoxicillin  Will order Flonase  Will place referral to ENT  Follow up:  Follow up with PCP if needed
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
## 2021-07-25 ENCOUNTER — Other Ambulatory Visit: Payer: Self-pay

## 2021-07-25 ENCOUNTER — Encounter: Payer: Self-pay | Admitting: Nurse Practitioner

## 2021-07-25 ENCOUNTER — Ambulatory Visit: Payer: Self-pay | Attending: Nurse Practitioner | Admitting: Nurse Practitioner

## 2021-07-25 VITALS — BP 131/81 | HR 75 | Ht 68.0 in | Wt 190.2 lb

## 2021-07-25 DIAGNOSIS — E119 Type 2 diabetes mellitus without complications: Secondary | ICD-10-CM

## 2021-07-25 DIAGNOSIS — E782 Mixed hyperlipidemia: Secondary | ICD-10-CM

## 2021-07-25 DIAGNOSIS — H9311 Tinnitus, right ear: Secondary | ICD-10-CM

## 2021-07-25 DIAGNOSIS — I10 Essential (primary) hypertension: Secondary | ICD-10-CM

## 2021-07-25 DIAGNOSIS — Z1211 Encounter for screening for malignant neoplasm of colon: Secondary | ICD-10-CM

## 2021-07-25 MED ORDER — HYDROCHLOROTHIAZIDE 25 MG PO TABS
25.0000 mg | ORAL_TABLET | Freq: Every day | ORAL | 1 refills | Status: DC
  Filled 2021-07-25 (×2): qty 90, 90d supply, fill #0

## 2021-07-25 MED ORDER — ATORVASTATIN CALCIUM 20 MG PO TABS
20.0000 mg | ORAL_TABLET | Freq: Every day | ORAL | 3 refills | Status: DC
  Filled 2021-07-25 (×2): qty 90, 90d supply, fill #0

## 2021-07-25 MED ORDER — PREDNISONE 20 MG PO TABS
20.0000 mg | ORAL_TABLET | Freq: Every day | ORAL | 0 refills | Status: AC
  Filled 2021-07-25 (×2): qty 7, 7d supply, fill #0
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
## 2021-07-26 ENCOUNTER — Telehealth: Payer: Self-pay

## 2021-07-26 ENCOUNTER — Encounter: Payer: Self-pay | Admitting: Nurse Practitioner

## 2021-07-26 LAB — CMP14+EGFR
ALT: 46 IU/L — ABNORMAL HIGH (ref 0–44)
AST: 27 IU/L (ref 0–40)
Albumin/Globulin Ratio: 2.2 (ref 1.2–2.2)
Albumin: 5 g/dL — ABNORMAL HIGH (ref 3.8–4.8)
Alkaline Phosphatase: 84 IU/L (ref 44–121)
BUN/Creatinine Ratio: 16 (ref 10–24)
BUN: 16 mg/dL (ref 8–27)
Bilirubin Total: 0.6 mg/dL (ref 0.0–1.2)
CO2: 28 mmol/L (ref 20–29)
Calcium: 9.9 mg/dL (ref 8.6–10.2)
Chloride: 94 mmol/L — ABNORMAL LOW (ref 96–106)
Creatinine, Ser: 1.03 mg/dL (ref 0.76–1.27)
Globulin, Total: 2.3 g/dL (ref 1.5–4.5)
Glucose: 127 mg/dL — ABNORMAL HIGH (ref 70–99)
Potassium: 4.2 mmol/L (ref 3.5–5.2)
Sodium: 138 mmol/L (ref 134–144)
Total Protein: 7.3 g/dL (ref 6.0–8.5)
eGFR: 82 mL/min/{1.73_m2} (ref >59)

## 2021-07-26 LAB — HEMOGLOBIN A1C
Est. average glucose Bld gHb Est-mCnc: 151 mg/dL
Hgb A1c MFr Bld: 6.9 % — ABNORMAL HIGH (ref 4.8–5.6)

## 2021-07-26 NOTE — Telephone Encounter (Signed)
Called patient reviewed all information and repeated back to me. Will call if any questions.  ? ?

## 2021-07-26 NOTE — Telephone Encounter (Signed)
-----   Message from Claiborne Rigg, NP sent at 07/26/2021  9:56 AM EST ----- A1c is nearing the level where we would need to put you on diabetes medication. Please try to work on limiting your intake of sweets, breads, pasta, rice, potatoes or anything fried in white flour. Also I want you trying to get out and walk at least 3 times a week. Will recheck at next office visit.  Kidney, liver function and electrolytes are normal.

## 2021-10-22 ENCOUNTER — Encounter: Payer: Self-pay | Admitting: Nurse Practitioner

## 2021-10-22 ENCOUNTER — Other Ambulatory Visit: Payer: Self-pay

## 2021-10-22 ENCOUNTER — Ambulatory Visit: Payer: Self-pay | Attending: Nurse Practitioner | Admitting: Nurse Practitioner

## 2021-10-22 VITALS — BP 156/89 | HR 86 | Wt 194.2 lb

## 2021-10-22 DIAGNOSIS — E782 Mixed hyperlipidemia: Secondary | ICD-10-CM

## 2021-10-22 DIAGNOSIS — N529 Male erectile dysfunction, unspecified: Secondary | ICD-10-CM

## 2021-10-22 DIAGNOSIS — E119 Type 2 diabetes mellitus without complications: Secondary | ICD-10-CM

## 2021-10-22 DIAGNOSIS — I10 Essential (primary) hypertension: Secondary | ICD-10-CM

## 2021-10-22 DIAGNOSIS — B351 Tinea unguium: Secondary | ICD-10-CM

## 2021-10-22 MED ORDER — HYDROCHLOROTHIAZIDE 25 MG PO TABS
25.0000 mg | ORAL_TABLET | Freq: Every day | ORAL | 1 refills | Status: DC
  Filled 2021-10-22: qty 90, 90d supply, fill #0
  Filled 2022-02-05: qty 90, 90d supply, fill #1

## 2021-10-22 MED ORDER — TERBINAFINE HCL 250 MG PO TABS
250.0000 mg | ORAL_TABLET | Freq: Every day | ORAL | 0 refills | Status: AC
Start: 2021-10-22 — End: 2022-06-12
  Filled 2021-10-22: qty 30, 30d supply, fill #0
  Filled 2022-03-26: qty 30, 30d supply, fill #1
  Filled 2022-05-13: qty 30, 30d supply, fill #2

## 2021-10-22 MED ORDER — SILDENAFIL CITRATE 100 MG PO TABS
50.0000 mg | ORAL_TABLET | Freq: Every day | ORAL | 11 refills | Status: DC | PRN
  Filled 2021-10-22: qty 10, 30d supply, fill #0
  Filled 2022-02-05: qty 10, 30d supply, fill #1

## 2021-10-22 MED ORDER — ATORVASTATIN CALCIUM 20 MG PO TABS
20.0000 mg | ORAL_TABLET | Freq: Every day | ORAL | 3 refills | Status: DC
Start: 2021-10-22 — End: 2022-09-09
  Filled 2021-10-22: qty 90, 90d supply, fill #0
  Filled 2022-02-05: qty 90, 90d supply, fill #1
  Filled 2022-06-07: qty 90, 90d supply, fill #2
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
## 2021-11-27 NOTE — Progress Notes (Signed)
? ?  S:    ?Larry Green is a 61 y.o. male who presents for hypertension evaluation, education, and management. PMH is significant for HTN, HLD, tobacco use. Patient was referred and last seen by Primary Care Provider, Geryl Rankins, on 10/22/21, when BP was 156/89. No medication changes were made at that time.  ? ?Today, patient arrives in good spirits and presents without assistance and is accompanied by his wife. Denies dizziness, headache, blurred vision, swelling.  ? ?Family/Social history:  ?-Former smoker (quit 2022) ?-Fhx DM in sister and brother ? ?Medication adherence reported - missed one day in the past week. Patient has taken BP medications today.  ? ?Current antihypertensives include: hydrochlorothiazide 25 mg daily ? ?Antihypertensives tried in the past include: lisinopril (cough) ? ?Reported home BP readings: has an upper arm cuff, hasn't checked much lately but said BP went back to normal after last visit ? ?Patient reported dietary habits: no coffee, endorses low salt diet ? ?Patient-reported exercise habits: walks, uses [employer]; twice per week  ? ?ASCVD risk factors include: HTN, smoking, HLD ? ?O:  ?Today's Vitals  ? 12/03/21 0907  ?BP: 129/78  ?Pulse: 72  ? ?There is no height or weight on file to calculate BMI. ? ?Last 3 Office BP readings: ?BP Readings from Last 3 Encounters:  ?12/03/21 129/78  ?10/22/21 (!) 156/89  ?07/25/21 131/81  ? ?BMET ?   ?Component Value Date/Time  ? NA 138 07/25/2021 1200  ? K 4.2 07/25/2021 1200  ? CL 94 (L) 07/25/2021 1200  ? CO2 28 07/25/2021 1200  ? GLUCOSE 127 (H) 07/25/2021 1200  ? GLUCOSE 103 (H) 02/03/2014 1415  ? BUN 16 07/25/2021 1200  ? CREATININE 1.03 07/25/2021 1200  ? CALCIUM 9.9 07/25/2021 1200  ? GFRNONAA 82 05/30/2017 1154  ? GFRAA 94 05/30/2017 1154  ? ?Renal function: ?CrCl cannot be calculated (Patient's most recent lab result is older than the maximum 21 days allowed.). ? ?Clinical ASCVD: No  ?The 10-year ASCVD risk score (Arnett DK, et al.,  2019) is: 27.4% ?  Values used to calculate the score: ?    Age: 50 years ?    Sex: Male ?    Is Non-Hispanic African American: Yes ?    Diabetic: Yes ?    Tobacco smoker: No ?    Systolic Blood Pressure: [id number] mmHg ?    Is BP treated: Yes ?    HDL Cholesterol: 53 mg/dL ?    Total Cholesterol: 259 mg/dL ? ?A/P: ?Hypertension diagnosed currently controlled on current medications. BP goal < 130/80 mmHg. Medication adherence appears appropriate.  ?-Continue hydrochlorothiazide 25 mg daily.  ?-Counseled on lifestyle modifications for blood pressure control including reduced dietary sodium, increased exercise, adequate sleep. ?-Encouraged patient to check BP at home and bring log of readings to next visit. Counseled on proper use of home BP cuff.  ? ?Results reviewed and written information provided. Patient verbalized understanding of treatment plan. Total time in face-to-face counseling 26 minutes.  ? ?F/u clinic visit at next PCP visit 01/23/22.  ? ?Rebbeca Paul, PharmD ?PGY2 Ambulatory Care Pharmacy Resident ?12/03/2021 9:08 AM ?

## 2021-12-03 ENCOUNTER — Other Ambulatory Visit: Payer: Self-pay

## 2021-12-03 ENCOUNTER — Ambulatory Visit: Payer: Self-pay | Attending: Nurse Practitioner | Admitting: Pharmacist

## 2021-12-03 VITALS — BP 129/78 | HR 72

## 2021-12-03 DIAGNOSIS — B351 Tinea unguium: Secondary | ICD-10-CM

## 2021-12-03 DIAGNOSIS — I10 Essential (primary) hypertension: Secondary | ICD-10-CM

## 2021-12-03 DIAGNOSIS — E119 Type 2 diabetes mellitus without complications: Secondary | ICD-10-CM

## 2021-12-04 LAB — HEPATIC FUNCTION PANEL
ALT: 27 IU/L (ref 0–44)
AST: 20 IU/L (ref 0–40)
Albumin: 4.4 g/dL (ref 3.8–4.8)
Alkaline Phosphatase: 77 IU/L (ref 44–121)
Bilirubin Total: 0.2 mg/dL (ref 0.0–1.2)
Bilirubin, Direct: <0.1 mg/dL (ref 0.00–0.40)
Total Protein: 6.8 g/dL (ref 6.0–8.5)

## 2021-12-04 LAB — MICROALBUMIN / CREATININE URINE RATIO
Creatinine, Urine: 100.1 mg/dL
Microalb/Creat Ratio: 4 mg/g creat (ref 0–29)
Microalbumin, Urine: 3.6 ug/mL

## 2021-12-04 LAB — HEMOGLOBIN A1C
Est. average glucose Bld gHb Est-mCnc: 154 mg/dL
Hgb A1c MFr Bld: 7 % — ABNORMAL HIGH (ref 4.8–5.6)

## 2021-12-07 ENCOUNTER — Other Ambulatory Visit: Payer: Self-pay

## 2021-12-07 ENCOUNTER — Other Ambulatory Visit: Payer: Self-pay | Admitting: Nurse Practitioner

## 2021-12-07 MED ORDER — METFORMIN HCL 500 MG PO TABS
500.0000 mg | ORAL_TABLET | Freq: Every day | ORAL | 1 refills | Status: DC
  Filled 2021-12-07: qty 90, 90d supply, fill #0
  Filled 2022-03-26: qty 90, 90d supply, fill #1

## 2021-12-11 ENCOUNTER — Other Ambulatory Visit: Payer: Self-pay

## 2022-01-23 ENCOUNTER — Other Ambulatory Visit: Payer: Self-pay

## 2022-01-23 ENCOUNTER — Encounter: Payer: Self-pay | Admitting: Nurse Practitioner

## 2022-01-23 ENCOUNTER — Ambulatory Visit: Payer: Self-pay | Attending: Nurse Practitioner | Admitting: Nurse Practitioner

## 2022-01-23 VITALS — BP 128/70 | HR 56 | Temp 98.4°F | Ht 68.0 in | Wt 190.6 lb

## 2022-01-23 DIAGNOSIS — I10 Essential (primary) hypertension: Secondary | ICD-10-CM

## 2022-01-23 DIAGNOSIS — B351 Tinea unguium: Secondary | ICD-10-CM

## 2022-01-23 DIAGNOSIS — E785 Hyperlipidemia, unspecified: Secondary | ICD-10-CM

## 2022-01-23 MED ORDER — TERBINAFINE HCL 250 MG PO TABS
250.0000 mg | ORAL_TABLET | Freq: Every day | ORAL | 0 refills | Status: DC
  Filled 2022-01-23: qty 60, 60d supply, fill #0
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
## 2022-01-24 LAB — LIPID PANEL
Chol/HDL Ratio: 4.5 ratio (ref 0.0–5.0)
Cholesterol, Total: 199 mg/dL (ref 100–199)
HDL: 44 mg/dL (ref >39)
LDL Chol Calc (NIH): 107 mg/dL — ABNORMAL HIGH (ref 0–99)
Triglycerides: 281 mg/dL — ABNORMAL HIGH (ref 0–149)
VLDL Cholesterol Cal: 48 mg/dL — ABNORMAL HIGH (ref 5–40)

## 2022-02-05 ENCOUNTER — Other Ambulatory Visit: Payer: Self-pay

## 2022-03-26 ENCOUNTER — Other Ambulatory Visit: Payer: Self-pay

## 2022-04-29 ENCOUNTER — Ambulatory Visit: Payer: Self-pay | Admitting: Nurse Practitioner

## 2022-05-13 ENCOUNTER — Other Ambulatory Visit: Payer: Self-pay

## 2022-06-07 ENCOUNTER — Other Ambulatory Visit: Payer: Self-pay

## 2022-06-07 ENCOUNTER — Encounter: Payer: Self-pay | Admitting: Nurse Practitioner

## 2022-06-07 ENCOUNTER — Ambulatory Visit: Payer: Self-pay | Attending: Nurse Practitioner | Admitting: Nurse Practitioner

## 2022-06-07 VITALS — BP 149/83 | HR 73 | Ht 68.0 in | Wt 187.4 lb

## 2022-06-07 DIAGNOSIS — I10 Essential (primary) hypertension: Secondary | ICD-10-CM

## 2022-06-07 DIAGNOSIS — Z1211 Encounter for screening for malignant neoplasm of colon: Secondary | ICD-10-CM

## 2022-06-07 DIAGNOSIS — Z23 Encounter for immunization: Secondary | ICD-10-CM

## 2022-06-07 DIAGNOSIS — E119 Type 2 diabetes mellitus without complications: Secondary | ICD-10-CM

## 2022-06-07 DIAGNOSIS — Z130 Encounter for screening for diseases of the blood and blood-forming organs and certain disorders involving the immune mechanism: Secondary | ICD-10-CM

## 2022-06-07 DIAGNOSIS — E782 Mixed hyperlipidemia: Secondary | ICD-10-CM

## 2022-06-07 LAB — POCT GLYCOSYLATED HEMOGLOBIN (HGB A1C): HbA1c, POC (controlled diabetic range): 6.5 % (ref 0.0–7.0)

## 2022-06-07 MED ORDER — HYDROCHLOROTHIAZIDE 25 MG PO TABS
25.0000 mg | ORAL_TABLET | Freq: Every day | ORAL | 1 refills | Status: DC
  Filled 2022-06-07: qty 90, 90d supply, fill #0
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
## 2022-06-08 LAB — LIPID PANEL
Chol/HDL Ratio: 3.4 ratio (ref 0.0–5.0)
Cholesterol, Total: 178 mg/dL (ref 100–199)
HDL: 53 mg/dL (ref >39)
LDL Chol Calc (NIH): 93 mg/dL (ref 0–99)
Triglycerides: 186 mg/dL — ABNORMAL HIGH (ref 0–149)
VLDL Cholesterol Cal: 32 mg/dL (ref 5–40)

## 2022-06-08 LAB — CBC WITH DIFFERENTIAL/PLATELET
Basophils Absolute: 0 10*3/uL (ref 0.0–0.2)
Basos: 0 %
EOS (ABSOLUTE): 0.1 10*3/uL (ref 0.0–0.4)
Eos: 2 %
Hematocrit: 46.2 % (ref 37.5–51.0)
Hemoglobin: 15.4 g/dL (ref 13.0–17.7)
Immature Grans (Abs): 0 10*3/uL (ref 0.0–0.1)
Immature Granulocytes: 0 %
Lymphocytes Absolute: 2 10*3/uL (ref 0.7–3.1)
Lymphs: 37 %
MCH: 29.8 pg (ref 26.6–33.0)
MCHC: 33.3 g/dL (ref 31.5–35.7)
MCV: 90 fL (ref 79–97)
Monocytes Absolute: 0.6 10*3/uL (ref 0.1–0.9)
Monocytes: 10 %
Neutrophils Absolute: 2.7 10*3/uL (ref 1.4–7.0)
Neutrophils: 51 %
Platelets: 352 10*3/uL (ref 150–450)
RBC: 5.16 x10E6/uL (ref 4.14–5.80)
RDW: 12.4 % (ref 11.6–15.4)
WBC: 5.4 10*3/uL (ref 3.4–10.8)

## 2022-06-08 LAB — CMP14+EGFR
ALT: 25 IU/L (ref 0–44)
AST: 21 IU/L (ref 0–40)
Albumin/Globulin Ratio: 2.2 (ref 1.2–2.2)
Albumin: 4.6 g/dL (ref 3.9–4.9)
Alkaline Phosphatase: 70 IU/L (ref 44–121)
BUN/Creatinine Ratio: 15 (ref 10–24)
BUN: 15 mg/dL (ref 8–27)
Bilirubin Total: 0.3 mg/dL (ref 0.0–1.2)
CO2: 26 mmol/L (ref 20–29)
Calcium: 9 mg/dL (ref 8.6–10.2)
Chloride: 100 mmol/L (ref 96–106)
Creatinine, Ser: 1 mg/dL (ref 0.76–1.27)
Globulin, Total: 2.1 g/dL (ref 1.5–4.5)
Glucose: 117 mg/dL — ABNORMAL HIGH (ref 70–99)
Potassium: 4.1 mmol/L (ref 3.5–5.2)
Sodium: 138 mmol/L (ref 134–144)
Total Protein: 6.7 g/dL (ref 6.0–8.5)
eGFR: 85 mL/min/{1.73_m2} (ref >59)

## 2022-06-25 ENCOUNTER — Ambulatory Visit: Payer: Self-pay

## 2022-06-25 NOTE — Telephone Encounter (Signed)
Advised of the mobile unit.  Declines at this time.   Advised to stay hydrated, rest,  take medications, alternate Tylenol and Advil for body aches. Feels like he has fever and unable to sleep last night d/t coughing. Denies SOB.   States he traveled recently to [place] and changed flights. He did not have mask on all the time but admits that he did wear one.

## 2022-06-25 NOTE — Telephone Encounter (Signed)
Pt and spouse called in for assistance / schedule an appt. Pt is experiencing cough, body aches, cough. Pt says that he feels really bad and was unable to work today.   Pt would like further assistance.     Chief Complaint: Cough, body aches Symptoms: Above Frequency: Yesterday Pertinent Negatives: Patient denies  Disposition: [] ED /[] Urgent Care (no appt availability in office) / [] Appointment(In office/virtual)/ []  [facility]/ [] Home Care/ [] Refused Recommended Disposition /[] [place] [facility]/ [x]  Follow-up with PCP Additional Notes: Pt. Unable to do virtual visit. Asking to be worked in . Please advise pt.  Answer Assessment - Initial Assessment Questions 1. ONSET: "When did the cough begin?"      Yesterday 2. SEVERITY: "How bad is the cough today?"      Severe 3. SPUTUM: "Describe the color of your sputum" (none, dry cough; clear, white, yellow, green)     Clear 4. HEMOPTYSIS: "Are you coughing up any blood?" If so ask: "How much?" (flecks, streaks, tablespoons, etc.)     No 5. DIFFICULTY BREATHING: "Are you having difficulty breathing?" If Yes, ask: "How bad is it?" (e.g., mild, moderate, severe)    - MILD: No SOB at rest, mild SOB with walking, speaks normally in sentences, can lie down, no retractions, pulse < 100.    - MODERATE: SOB at rest, SOB with minimal exertion and prefers to sit, cannot lie down flat, speaks in phrases, mild retractions, audible wheezing, pulse 100-120.    - SEVERE: Very SOB at rest, speaks in single words, struggling to breathe, sitting hunched forward, retractions, pulse > 120      No 6. FEVER: "Do you have a fever?" If Yes, ask: "What is your temperature, how was it measured, and when did it start?"     Yes 7. CARDIAC HISTORY: "Do you have any history of heart disease?" (e.g., heart attack, congestive heart failure)      No 8. LUNG HISTORY: "Do you have any history of lung disease?"  (e.g., pulmonary embolus,  asthma, emphysema)     No 9. PE RISK FACTORS: "Do you have a history of blood clots?" (or: recent major surgery, recent prolonged travel, bedridden)     No 10. OTHER SYMPTOMS: "Do you have any other symptoms?" (e.g., runny nose, wheezing, chest pain)       Body aches 11. PREGNANCY: "Is there any chance you are pregnant?" "When was your last menstrual period?"       N/a 12. TRAVEL: "Have you traveled out of the country in the last month?" (e.g., travel history, exposures)       No  Protocols used: Cough - Acute Productive-A-AH

## 2022-06-26 NOTE — Telephone Encounter (Signed)
Needs to take a HOME covid test. If positive we can prescribe antiviral. Please also give schedule for mobile bus. Thanks

## 2022-06-27 NOTE — Telephone Encounter (Signed)
Patient identified by name and date of birth. Patient states he has symptoms under control now and feels that he is pretty much over the worst of it and declined to take the at home test.

## 2022-07-11 ENCOUNTER — Other Ambulatory Visit: Payer: Self-pay

## 2022-07-12 LAB — FECAL OCCULT BLOOD, IMMUNOCHEMICAL: Fecal Occult Bld: NEGATIVE

## 2022-09-02 ENCOUNTER — Other Ambulatory Visit: Payer: Self-pay

## 2022-09-02 NOTE — Progress Notes (Signed)
Patient attempted to be outreached by Levi Aland, PharmD Candidate on 09/02/22 to discuss hypertension. Left voicemail for patient to return our call at their convenience at [phone].   Levi Aland, PharmD Candidate, Class of [phone]  [place], [place].D. PGY-2 [facility] Pharm[facility] Resident 09/02/2022 1:29 PM

## 2022-09-05 ENCOUNTER — Telehealth: Payer: Self-pay

## 2022-09-05 NOTE — Telephone Encounter (Signed)
Patient attempted to be outreached by Junius Finner, PharmD Candidate on 09/05/2022 to discuss hypertension. Voicemail was not set up, unable to leave message to return call.   [place] [facility]  PharmD Candidate 2024   I attest that I have reviewed the pharmacy student note and verified the documentation and medical decision making.  Maryan Puls, PharmD PGY-1 [facility] Pharm[facility] Resident

## 2022-09-09 ENCOUNTER — Encounter: Payer: Self-pay | Admitting: Nurse Practitioner

## 2022-09-09 ENCOUNTER — Ambulatory Visit: Payer: Self-pay | Attending: Nurse Practitioner | Admitting: Nurse Practitioner

## 2022-09-09 ENCOUNTER — Other Ambulatory Visit: Payer: Self-pay

## 2022-09-09 VITALS — BP 139/85 | HR 94 | Ht 68.0 in | Wt 190.0 lb

## 2022-09-09 DIAGNOSIS — E119 Type 2 diabetes mellitus without complications: Secondary | ICD-10-CM

## 2022-09-09 DIAGNOSIS — Z23 Encounter for immunization: Secondary | ICD-10-CM

## 2022-09-09 DIAGNOSIS — E782 Mixed hyperlipidemia: Secondary | ICD-10-CM

## 2022-09-09 DIAGNOSIS — R911 Solitary pulmonary nodule: Secondary | ICD-10-CM

## 2022-09-09 DIAGNOSIS — I10 Essential (primary) hypertension: Secondary | ICD-10-CM

## 2022-09-09 DIAGNOSIS — Z122 Encounter for screening for malignant neoplasm of respiratory organs: Secondary | ICD-10-CM

## 2022-09-09 MED ORDER — METFORMIN HCL 500 MG PO TABS
500.0000 mg | ORAL_TABLET | Freq: Every day | ORAL | 1 refills | Status: DC
  Filled 2022-09-09: qty 90, 90d supply, fill #0

## 2022-09-09 MED ORDER — HYDROCHLOROTHIAZIDE 25 MG PO TABS
25.0000 mg | ORAL_TABLET | Freq: Every day | ORAL | 1 refills | Status: DC
  Filled 2022-09-09: qty 90, 90d supply, fill #0
  Filled 2022-12-04: qty 90, 90d supply, fill #1

## 2022-09-09 MED ORDER — METFORMIN HCL 500 MG PO TABS: 500.0000 mg | ORAL_TABLET | Freq: Every day | ORAL | 1 refills | Status: DC

## 2022-09-09 MED ORDER — ATORVASTATIN CALCIUM 20 MG PO TABS
20.0000 mg | ORAL_TABLET | Freq: Every day | ORAL | 3 refills | Status: DC
  Filled 2022-09-09: qty 90, 90d supply, fill #0
  Filled 2022-12-04: qty 90, 90d supply, fill #1
  Filled 2023-03-11: qty 90, 90d supply, fill #2

## 2022-09-09 MED ORDER — TERBINAFINE HCL 250 MG PO TABS
250.0000 mg | ORAL_TABLET | Freq: Every day | ORAL | 0 refills | Status: DC
  Filled 2022-09-09: qty 30, 30d supply, fill #0
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
## 2022-09-10 ENCOUNTER — Other Ambulatory Visit: Payer: Self-pay

## 2022-09-10 NOTE — Progress Notes (Signed)
Patient outreached by Donney Rankins, PharmD Candidate on 09/10/2022 to discuss hypertension.    Patient has an automated home blood pressure machine. He reports home readings of systolic [id number] and diastolic XX123456.  He reports that he is adherent to his BP medication and that he does not need any refills as he was given refills on yesterday when he saw his PCP.  BP at OV on yesterday was 139/85.  Pt is not experiencing side effects with any of his medications.  Pt reports that he eats healthy (salads, vegetables, no fried foods) and exercises (walks) 2 days out the week.  Pt reminded of follow-up visit with PCP and advised to contact us or his PCP if any questions arise.    Medication review was performed. They are taking medications as prescribed.   The following barriers to adherence were noted:  - They do not need assistance obtaining refills.  - They do not occasionally forget to take some of their prescribed medications.  - They do not feel like one/some of their medications make them feel poorly.  - They do not have questions or concerns about their medications.  - They do have follow up scheduled with their primary care provider/cardiologist.   The following interventions were completed:  - Medications were reviewed   The patient has follow up scheduled:  PCP: [facility]  PharmD Candidate 2024   Maryan Puls, PharmD PGY-1 [facility] Pharmacy Resident

## 2022-09-16 ENCOUNTER — Other Ambulatory Visit: Payer: Self-pay

## 2022-12-04 ENCOUNTER — Other Ambulatory Visit: Payer: Self-pay | Admitting: Nurse Practitioner

## 2022-12-04 ENCOUNTER — Other Ambulatory Visit: Payer: Self-pay

## 2022-12-06 ENCOUNTER — Encounter: Payer: Self-pay | Admitting: Pharmacist

## 2022-12-06 ENCOUNTER — Other Ambulatory Visit: Payer: Self-pay

## 2022-12-09 ENCOUNTER — Other Ambulatory Visit: Payer: Self-pay

## 2022-12-09 ENCOUNTER — Encounter: Payer: Self-pay | Admitting: Nurse Practitioner

## 2022-12-09 ENCOUNTER — Ambulatory Visit: Payer: Self-pay | Attending: Nurse Practitioner | Admitting: Nurse Practitioner

## 2022-12-09 VITALS — BP 134/76 | HR 77 | Ht 68.0 in | Wt 191.6 lb

## 2022-12-09 DIAGNOSIS — Z23 Encounter for immunization: Secondary | ICD-10-CM

## 2022-12-09 DIAGNOSIS — E119 Type 2 diabetes mellitus without complications: Secondary | ICD-10-CM

## 2022-12-09 DIAGNOSIS — Z7984 Long term (current) use of oral hypoglycemic drugs: Secondary | ICD-10-CM

## 2022-12-09 DIAGNOSIS — I10 Essential (primary) hypertension: Secondary | ICD-10-CM

## 2022-12-09 DIAGNOSIS — G629 Polyneuropathy, unspecified: Secondary | ICD-10-CM

## 2022-12-09 DIAGNOSIS — E782 Mixed hyperlipidemia: Secondary | ICD-10-CM

## 2022-12-09 DIAGNOSIS — B351 Tinea unguium: Secondary | ICD-10-CM

## 2022-12-09 LAB — POCT GLYCOSYLATED HEMOGLOBIN (HGB A1C): Hemoglobin A1C: 7.2 % — AB (ref 4.0–5.6)

## 2022-12-09 MED ORDER — TERBINAFINE HCL 250 MG PO TABS
250.0000 mg | ORAL_TABLET | Freq: Every day | ORAL | 0 refills | Status: DC
  Filled 2022-12-09 (×2): qty 90, 90d supply, fill #0

## 2022-12-09 MED ORDER — ZOSTER VAC RECOMB ADJUVANTED 50 MCG/0.5ML IM SUSR
0.5000 mL | Freq: Once | INTRAMUSCULAR | 0 refills | Status: AC
  Filled 2022-12-09: qty 0.5, 1d supply, fill #0

## 2022-12-09 MED ORDER — HYDROCHLOROTHIAZIDE 25 MG PO TABS
25.0000 mg | ORAL_TABLET | Freq: Every day | ORAL | 1 refills | Status: DC
  Filled 2022-12-09: qty 90, 90d supply, fill #0

## 2022-12-09 NOTE — Progress Notes (Signed)
Medication for toenail fungus Tingling throughout  body
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
## 2022-12-10 LAB — CMP14+EGFR
ALT: 33 IU/L (ref 0–44)
AST: 22 IU/L (ref 0–40)
Albumin/Globulin Ratio: 1.9 (ref 1.2–2.2)
Albumin: 4.6 g/dL (ref 3.9–4.9)
Alkaline Phosphatase: 80 IU/L (ref 44–121)
BUN/Creatinine Ratio: 14 (ref 10–24)
BUN: 14 mg/dL (ref 8–27)
Bilirubin Total: 0.4 mg/dL (ref 0.0–1.2)
CO2: 21 mmol/L (ref 20–29)
Calcium: 9.4 mg/dL (ref 8.6–10.2)
Chloride: 98 mmol/L (ref 96–106)
Creatinine, Ser: 0.99 mg/dL (ref 0.76–1.27)
Globulin, Total: 2.4 g/dL (ref 1.5–4.5)
Glucose: 180 mg/dL — ABNORMAL HIGH (ref 70–99)
Potassium: 4.1 mmol/L (ref 3.5–5.2)
Sodium: 138 mmol/L (ref 134–144)
Total Protein: 7 g/dL (ref 6.0–8.5)
eGFR: 86 mL/min/{1.73_m2} (ref >59)

## 2022-12-10 LAB — THYROID PANEL WITH TSH
Free Thyroxine Index: 1.4 (ref 1.2–4.9)
T3 Uptake Ratio: 27 % (ref 24–39)
T4, Total: 5.3 ug/dL (ref 4.5–12.0)
TSH: 1.14 u[IU]/mL (ref 0.450–4.500)

## 2022-12-10 LAB — ANA: Anti Nuclear Antibody (ANA): NEGATIVE

## 2022-12-10 LAB — SEDIMENTATION RATE: Sed Rate: 2 mm/hr (ref 0–30)

## 2022-12-10 LAB — MICROALBUMIN / CREATININE URINE RATIO
Creatinine, Urine: 137.6 mg/dL
Microalb/Creat Ratio: 6 mg/g creat (ref 0–29)
Microalbumin, Urine: 8.5 ug/mL

## 2022-12-10 LAB — VITAMIN B12: Vitamin B-12: 749 pg/mL (ref 232–1245)

## 2022-12-16 ENCOUNTER — Encounter: Payer: Self-pay | Admitting: Nurse Practitioner

## 2022-12-16 MED ORDER — METFORMIN HCL 500 MG PO TABS
500.0000 mg | ORAL_TABLET | Freq: Every day | ORAL | 1 refills | Status: DC
Start: 2022-12-16 — End: 2023-03-11
  Filled 2022-12-16: qty 90, 90d supply, fill #0

## 2022-12-17 ENCOUNTER — Other Ambulatory Visit: Payer: Self-pay

## 2022-12-24 ENCOUNTER — Other Ambulatory Visit: Payer: Self-pay

## 2023-01-20 ENCOUNTER — Ambulatory Visit: Payer: Self-pay | Attending: Nurse Practitioner

## 2023-01-20 DIAGNOSIS — B351 Tinea unguium: Secondary | ICD-10-CM

## 2023-01-21 ENCOUNTER — Encounter (HOSPITAL_COMMUNITY): Payer: Self-pay

## 2023-01-21 ENCOUNTER — Emergency Department (HOSPITAL_COMMUNITY): Payer: Medicaid Other

## 2023-01-21 ENCOUNTER — Other Ambulatory Visit: Payer: Self-pay

## 2023-01-21 ENCOUNTER — Emergency Department (HOSPITAL_COMMUNITY)
Admission: EM | Admit: 2023-01-21 | Discharge: 2023-01-21 | Disposition: A | Discharge status: Home / Self Care | Payer: Medicaid Other | Attending: Emergency Medicine | Admitting: Emergency Medicine

## 2023-01-21 DIAGNOSIS — M5459 Other low back pain: Secondary | ICD-10-CM | POA: Diagnosis not present

## 2023-01-21 DIAGNOSIS — M545 Low back pain, unspecified: Secondary | ICD-10-CM | POA: Insufficient documentation

## 2023-01-21 DIAGNOSIS — M25551 Pain in right hip: Secondary | ICD-10-CM | POA: Diagnosis not present

## 2023-01-21 DIAGNOSIS — M1611 Unilateral primary osteoarthritis, right hip: Secondary | ICD-10-CM | POA: Diagnosis not present

## 2023-01-21 DIAGNOSIS — M5136 Other intervertebral disc degeneration, lumbar region: Secondary | ICD-10-CM | POA: Diagnosis not present

## 2023-01-21 DIAGNOSIS — M47816 Spondylosis without myelopathy or radiculopathy, lumbar region: Secondary | ICD-10-CM | POA: Diagnosis not present

## 2023-01-21 LAB — HEPATIC FUNCTION PANEL
ALT: 29 IU/L (ref 0–44)
AST: 23 IU/L (ref 0–40)
Albumin: 4.4 g/dL (ref 3.9–4.9)
Alkaline Phosphatase: 75 IU/L (ref 44–121)
Bilirubin Total: 0.4 mg/dL (ref 0.0–1.2)
Bilirubin, Direct: 0.12 mg/dL (ref 0.00–0.40)
Total Protein: 6.7 g/dL (ref 6.0–8.5)

## 2023-01-21 MED ORDER — LIDOCAINE 5 % EX PTCH
1.0000 | MEDICATED_PATCH | CUTANEOUS | 0 refills | Status: DC
  Filled 2023-01-21: qty 30, 30d supply, fill #0

## 2023-01-21 MED ORDER — KETOROLAC TROMETHAMINE 15 MG/ML IJ SOLN
15.0000 mg | Freq: Once | INTRAMUSCULAR | Status: AC
  Administered 2023-01-21: 15 mg via INTRAVENOUS
  Filled 2023-01-21: qty 1

## 2023-01-21 MED ORDER — LIDOCAINE 5 % EX PTCH
1.0000 | MEDICATED_PATCH | CUTANEOUS | Status: DC
  Administered 2023-01-21: 1 via TRANSDERMAL
  Filled 2023-01-21: qty 1

## 2023-01-21 MED ORDER — IBUPROFEN 600 MG PO TABS
600.0000 mg | ORAL_TABLET | Freq: Four times a day (QID) | ORAL | 0 refills | Status: DC | PRN
  Filled 2023-01-21: qty 30, 8d supply, fill #0

## 2023-01-21 NOTE — ED Provider Notes (Signed)
[place] EMERGENCY DEPARTMENT AT [facility] Provider Note   CSN: [id number] Arrival date & time: 01/21/23  4098     History  Chief Complaint  Patient presents with   Back Pain    Larry Green is a 62 y.o. male.  62 year old male with prior medical history as detailed below presents for evaluation.  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  Approximately 2 weeks ago he finished building a large deck.  He feels that he may have strained the muscles in his low back at that time.  He reports gradually worsening pain to the right paraspinal lumbar muscles.  Pain radiates into the right buttock.  Patient reports significantly worsening pain over the last 4 to 5 days.  He reports that with standing, ambulation, heavy lifting the pain is worse.  He denies associated fever, urinary symptoms, bowel movement change, abdominal pain, or other complaint.  He denies weakness into his lower extremities.  He denies pain radiating into his lower extremities.  He reports taking Tylenol at home with minimal improvement in his pain.  The history is provided by the patient and medical records.       Home Medications Prior to Admission medications   Medication Sig Start Date End Date Taking? Authorizing Provider  atorvastatin (LIPITOR) 20 MG tablet Take 1 tablet (20 mg total) by mouth daily. 09/09/22   Claiborne Rigg, NP  hydrochlorothiazide (HYDRODIURIL) 25 MG tablet Take 1 tablet (25 mg total) by mouth daily. 12/09/22   Claiborne Rigg, NP  metFORMIN (GLUCOPHAGE) 500 MG tablet Take 1 tablet (500 mg total) by mouth daily with breakfast. 12/16/22 06/14/23  Claiborne Rigg, NP  sildenafil (VIAGRA) 100 MG tablet Take 0.5-1 tablets (50-100 mg total) by mouth daily as needed for erectile dysfunction. Patient not taking: Reported on 12/09/2022 10/22/21   Claiborne Rigg, NP  terbinafine (LAMISIL) 250 MG tablet Take 1 tablet (250 mg total) by mouth daily. 12/09/22   Claiborne Rigg, NP       Allergies    Lisinopril    Review of Systems   Review of Systems  All other systems reviewed and are negative.   Physical Exam Updated Vital Signs BP (!) 142/79   Pulse 65   Temp 97.6 F (36.4 C) (Oral)   Resp 16   SpO2 100%  Physical Exam Vitals and nursing note reviewed.  Constitutional:      General: He is not in acute distress.    Appearance: Normal appearance. He is well-developed.  HENT:     Head: Normocephalic and atraumatic.  Eyes:     Conjunctiva/sclera: Conjunctivae normal.     Pupils: Pupils are equal, round, and reactive to light.  Cardiovascular:     Rate and Rhythm: Normal rate and regular rhythm.     Heart sounds: Normal heart sounds.  Pulmonary:     Effort: Pulmonary effort is normal. No respiratory distress.     Breath sounds: Normal breath sounds.  Abdominal:     General: There is no distension.     Palpations: Abdomen is soft.     Tenderness: There is no abdominal tenderness.  Musculoskeletal:        General: No deformity. Normal range of motion.     Cervical back: Normal range of motion and neck supple.     Comments: Maximal tenderness with palpation in the right low lumbar paraspinal musculature and into the right superior gluteus maximus.  No overlying erythema.  No edema.  Patient is ambulatory with minimal antalgic gait.  Both lower extremities have 5 out of 5 strength.  Both lower extremities are neurovascular intact.  Skin:    General: Skin is warm and dry.  Neurological:     General: No focal deficit present.     Mental Status: He is alert and oriented to person, place, and time.     ED Results / Procedures / Treatments   Labs (all labs ordered are listed, but only abnormal results are displayed) Labs Reviewed - No data to display  EKG None  Radiology No results found.  Procedures Procedures    Medications Ordered in ED Medications  ketorolac (TORADOL) 15 MG/ML injection 15 mg (has no administration in time range)   lidocaine (LIDODERM) 5 % 1 patch (has no administration in time range)    ED Course/ Medical Decision Making/ A&P                             Medical Decision Making Amount and/or Complexity of Data Reviewed Radiology: ordered.  Risk Prescription drug management.    Medical Screen Complete  This patient presented to the ED with complaint of back pain.  This complaint involves an extensive number of treatment options. The initial differential diagnosis includes, but is not limited to, muscular strain, ligamentous injury, fracture, osteoarthritis, etc.  This presentation is: Acute, Self-Limited, Previously Undiagnosed, Uncertain Prognosis, Complicated, Systemic Symptoms, and Threat to Life/Bodily Function  Patient is presenting with complaint of right-sided low back pain.  Patient's presentation is entirely consistent with muscular strain related to heavy lifting that he does at work.  With administration of Toradol and Lidoderm patch the patient feels significantly improved.  Imaging does not reveal significant acute pathology.  Patient is reassured by ED evaluation and much more comfortable.  He desires discharge home.  Importance of close follow-up is stressed.  Strict return precautions given understood.      Additional history obtained: External records from outside sources obtained and reviewed including prior ED visits and prior Inpatient records.    Lab Tests:  I ordered and personally interpreted labs.  Imaging Studies ordered:  I ordered imaging studies including plain films of LS spine and pelvis and right hip I independently visualized and interpreted obtained imaging which showed mild osteoarthritis, no acute fracture I agree with the radiologist interpretation.  Medicines ordered:  I ordered medication including Toradol, Lidoderm patch for pain Reevaluation of the patient after these medicines showed that the patient: improved    Problem List / ED  Course:  Low back pain   Reevaluation:  After the interventions noted above, I reevaluated the patient and found that they have: improved  Disposition:  After consideration of the diagnostic results and the patients response to treatment, I feel that the patent would benefit from close outpatient follow-up.          Final Clinical Impression(s) / ED Diagnoses Final diagnoses:  Acute right-sided low back pain without sciatica    Rx / DC Orders ED Discharge Orders          Ordered    lidocaine (LIDODERM) 5 %  Every 24 hours        01/21/23 0931    ibuprofen (ADVIL) 600 MG tablet  Every 6 hours PRN        01/21/23 0931              Wynetta Fines,  MD 01/21/23 [phone]

## 2023-01-21 NOTE — Discharge Instructions (Addendum)
Return for any problem.  ?

## 2023-01-21 NOTE — ED Triage Notes (Signed)
Pt c/o right lower back pain that radiates down right legx4d. 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 Pt denies numbness and tingling. Pt denies loss of bowel or bladder

## 2023-03-11 ENCOUNTER — Encounter: Payer: Self-pay | Admitting: Nurse Practitioner

## 2023-03-11 ENCOUNTER — Ambulatory Visit: Payer: Self-pay | Attending: Nurse Practitioner | Admitting: Nurse Practitioner

## 2023-03-11 ENCOUNTER — Other Ambulatory Visit: Payer: Self-pay

## 2023-03-11 VITALS — BP 128/83 | HR 84 | Ht 68.0 in | Wt 182.6 lb

## 2023-03-11 DIAGNOSIS — I10 Essential (primary) hypertension: Secondary | ICD-10-CM

## 2023-03-11 DIAGNOSIS — E119 Type 2 diabetes mellitus without complications: Secondary | ICD-10-CM | POA: Diagnosis not present

## 2023-03-11 DIAGNOSIS — E785 Hyperlipidemia, unspecified: Secondary | ICD-10-CM | POA: Diagnosis not present

## 2023-03-11 DIAGNOSIS — M545 Low back pain, unspecified: Secondary | ICD-10-CM

## 2023-03-11 DIAGNOSIS — Z7984 Long term (current) use of oral hypoglycemic drugs: Secondary | ICD-10-CM

## 2023-03-11 LAB — POCT GLYCOSYLATED HEMOGLOBIN (HGB A1C): HbA1c, POC (controlled diabetic range): 6.7 % (ref 0.0–7.0)

## 2023-03-11 MED ORDER — IBUPROFEN 600 MG PO TABS
600.0000 mg | ORAL_TABLET | Freq: Four times a day (QID) | ORAL | 3 refills | Status: DC | PRN
Start: 2023-03-11 — End: 2023-12-09
  Filled 2023-03-11: qty 60, 15d supply, fill #0

## 2023-03-11 MED ORDER — LIDOCAINE 5 % EX PTCH
1.0000 | MEDICATED_PATCH | CUTANEOUS | 1 refills | Status: DC
Start: 2023-03-11 — End: 2023-12-09
  Filled 2023-03-11: qty 30, 30d supply, fill #0

## 2023-03-11 MED ORDER — HYDROCHLOROTHIAZIDE 25 MG PO TABS
25.0000 mg | ORAL_TABLET | Freq: Every day | ORAL | 1 refills | Status: DC
Start: 2023-03-11 — End: 2023-06-11
  Filled 2023-03-11: qty 90, 90d supply, fill #0
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
## 2023-03-12 ENCOUNTER — Other Ambulatory Visit: Payer: Self-pay | Admitting: Nurse Practitioner

## 2023-03-12 ENCOUNTER — Other Ambulatory Visit: Payer: Self-pay

## 2023-03-12 DIAGNOSIS — E782 Mixed hyperlipidemia: Secondary | ICD-10-CM

## 2023-03-12 LAB — BASIC METABOLIC PANEL
BUN/Creatinine Ratio: 14 (ref 10–24)
BUN: 14 mg/dL (ref 8–27)
CO2: 26 mmol/L (ref 20–29)
Calcium: 9.4 mg/dL (ref 8.6–10.2)
Chloride: 96 mmol/L (ref 96–106)
Creatinine, Ser: 1 mg/dL (ref 0.76–1.27)
Glucose: 162 mg/dL — ABNORMAL HIGH (ref 70–99)
Potassium: 3.5 mmol/L (ref 3.5–5.2)
Sodium: 137 mmol/L (ref 134–144)
eGFR: 84 mL/min/{1.73_m2} (ref >59)

## 2023-03-12 LAB — LIPID PANEL
Chol/HDL Ratio: 5.2 ratio — ABNORMAL HIGH (ref 0.0–5.0)
Cholesterol, Total: 202 mg/dL — ABNORMAL HIGH (ref 100–199)
HDL: 39 mg/dL — ABNORMAL LOW (ref >39)
LDL Chol Calc (NIH): 117 mg/dL — ABNORMAL HIGH (ref 0–99)
Triglycerides: 266 mg/dL — ABNORMAL HIGH (ref 0–149)
VLDL Cholesterol Cal: 46 mg/dL — ABNORMAL HIGH (ref 5–40)

## 2023-03-12 MED ORDER — ATORVASTATIN CALCIUM 40 MG PO TABS
40.0000 mg | ORAL_TABLET | Freq: Every day | ORAL | 1 refills | Status: DC
Start: 2023-03-12 — End: 2023-06-11
  Filled 2023-03-12: qty 90, 90d supply, fill #0

## 2023-03-18 ENCOUNTER — Other Ambulatory Visit: Payer: Self-pay

## 2023-04-08 DIAGNOSIS — H524 Presbyopia: Secondary | ICD-10-CM | POA: Diagnosis not present

## 2023-04-08 DIAGNOSIS — E119 Type 2 diabetes mellitus without complications: Secondary | ICD-10-CM | POA: Diagnosis not present

## 2023-05-01 DIAGNOSIS — H5213 Myopia, bilateral: Secondary | ICD-10-CM | POA: Diagnosis not present

## 2023-05-01 DIAGNOSIS — H5203 Hypermetropia, bilateral: Secondary | ICD-10-CM | POA: Diagnosis not present

## 2023-06-11 ENCOUNTER — Other Ambulatory Visit: Payer: Self-pay

## 2023-06-11 ENCOUNTER — Encounter: Payer: Self-pay | Admitting: Nurse Practitioner

## 2023-06-11 ENCOUNTER — Ambulatory Visit: Payer: Medicaid Other | Attending: Nurse Practitioner | Admitting: Nurse Practitioner

## 2023-06-11 VITALS — BP 136/82 | HR 79 | Ht 68.0 in | Wt 193.2 lb

## 2023-06-11 DIAGNOSIS — Z23 Encounter for immunization: Secondary | ICD-10-CM

## 2023-06-11 DIAGNOSIS — E119 Type 2 diabetes mellitus without complications: Secondary | ICD-10-CM

## 2023-06-11 DIAGNOSIS — I10 Essential (primary) hypertension: Secondary | ICD-10-CM

## 2023-06-11 DIAGNOSIS — Z7984 Long term (current) use of oral hypoglycemic drugs: Secondary | ICD-10-CM

## 2023-06-11 DIAGNOSIS — E782 Mixed hyperlipidemia: Secondary | ICD-10-CM | POA: Diagnosis not present

## 2023-06-11 MED ORDER — ATORVASTATIN CALCIUM 40 MG PO TABS
40.0000 mg | ORAL_TABLET | Freq: Every day | ORAL | 1 refills | Status: DC
Start: 2023-06-11 — End: 2023-12-09
  Filled 2023-06-11: qty 90, 90d supply, fill #0
  Filled 2023-09-23 (×2): qty 90, 90d supply, fill #1

## 2023-06-11 MED ORDER — HYDROCHLOROTHIAZIDE 25 MG PO TABS
25.0000 mg | ORAL_TABLET | Freq: Every day | ORAL | 1 refills | Status: DC
Start: 2023-06-11 — End: 2023-12-09
  Filled 2023-06-11: qty 90, 90d supply, fill #0
  Filled 2023-09-23 (×2): qty 90, 90d supply, fill #1
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
## 2023-06-12 LAB — CMP14+EGFR
ALT: 45 [IU]/L — ABNORMAL HIGH (ref 0–44)
AST: 38 [IU]/L (ref 0–40)
Albumin: 4.5 g/dL (ref 3.9–4.9)
Alkaline Phosphatase: 89 [IU]/L (ref 44–121)
BUN/Creatinine Ratio: 14 (ref 10–24)
BUN: 15 mg/dL (ref 8–27)
Bilirubin Total: 0.3 mg/dL (ref 0.0–1.2)
CO2: 16 mmol/L — ABNORMAL LOW (ref 20–29)
Calcium: 10.3 mg/dL — ABNORMAL HIGH (ref 8.6–10.2)
Chloride: 101 mmol/L (ref 96–106)
Creatinine, Ser: 1.04 mg/dL (ref 0.76–1.27)
Globulin, Total: 2.9 g/dL (ref 1.5–4.5)
Glucose: 159 mg/dL — ABNORMAL HIGH (ref 70–99)
Potassium: 4.9 mmol/L (ref 3.5–5.2)
Sodium: 139 mmol/L (ref 134–144)
Total Protein: 7.4 g/dL (ref 6.0–8.5)
eGFR: 80 mL/min/{1.73_m2} (ref >59)

## 2023-06-12 LAB — HEMOGLOBIN A1C
Est. average glucose Bld gHb Est-mCnc: 154 mg/dL
Hgb A1c MFr Bld: 7 % — ABNORMAL HIGH (ref 4.8–5.6)

## 2023-07-30 ENCOUNTER — Telehealth: Payer: Self-pay

## 2023-07-30 NOTE — Telephone Encounter (Signed)
 Copied from CRM [id number]. Topic: General - Inquiry >> Jul 30, 2023 10:20 AM Tobias CROME wrote: Reason for CRM: Pt has jury duty. Pt requesting a copy of excuse letter for jury duty that provider wrote a couple of years back, or a new one to be written if needed. Pt's date for jury duty is 08/25/2023 at 8:15am.   Please call pt when letter is ready for pick up.

## 2023-08-01 NOTE — Telephone Encounter (Signed)
Note in my chart 

## 2023-08-04 NOTE — Telephone Encounter (Signed)
 Unable to reach patient by phone.  Voicemail left with response from provider.

## 2023-09-23 ENCOUNTER — Other Ambulatory Visit (HOSPITAL_COMMUNITY): Payer: Self-pay

## 2023-09-23 ENCOUNTER — Other Ambulatory Visit: Payer: Self-pay

## 2023-12-09 ENCOUNTER — Other Ambulatory Visit: Payer: Self-pay

## 2023-12-09 ENCOUNTER — Encounter: Payer: Self-pay | Admitting: Nurse Practitioner

## 2023-12-09 ENCOUNTER — Ambulatory Visit: Payer: Medicaid Other | Attending: Nurse Practitioner | Admitting: Nurse Practitioner

## 2023-12-09 VITALS — BP 133/78 | HR 73 | Resp 19 | Ht 68.0 in | Wt 189.4 lb

## 2023-12-09 DIAGNOSIS — E782 Mixed hyperlipidemia: Secondary | ICD-10-CM | POA: Diagnosis not present

## 2023-12-09 DIAGNOSIS — I10 Essential (primary) hypertension: Secondary | ICD-10-CM

## 2023-12-09 DIAGNOSIS — Z1211 Encounter for screening for malignant neoplasm of colon: Secondary | ICD-10-CM

## 2023-12-09 DIAGNOSIS — E119 Type 2 diabetes mellitus without complications: Secondary | ICD-10-CM

## 2023-12-09 DIAGNOSIS — Z23 Encounter for immunization: Secondary | ICD-10-CM

## 2023-12-09 LAB — POCT GLYCOSYLATED HEMOGLOBIN (HGB A1C): HbA1c, POC (controlled diabetic range): 6.9 % (ref 0.0–7.0)

## 2023-12-09 MED ORDER — ATORVASTATIN CALCIUM 40 MG PO TABS
40.0000 mg | ORAL_TABLET | Freq: Every day | ORAL | 1 refills | Status: DC
Start: 2023-12-09 — End: 2023-12-11
  Filled 2023-12-09 (×2): qty 90, 90d supply, fill #0

## 2023-12-09 MED ORDER — HYDROCHLOROTHIAZIDE 25 MG PO TABS
25.0000 mg | ORAL_TABLET | Freq: Every day | ORAL | 1 refills | Status: DC
Start: 2023-12-09 — End: 2024-03-10
  Filled 2023-12-09 – 2024-01-27 (×2): qty 90, 90d supply, fill #0
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
## 2023-12-10 LAB — CMP14+EGFR
ALT: 38 IU/L (ref 0–44)
AST: 24 IU/L (ref 0–40)
Albumin: 4.9 g/dL (ref 3.9–4.9)
Alkaline Phosphatase: 93 IU/L (ref 44–121)
BUN/Creatinine Ratio: 11 (ref 10–24)
BUN: 12 mg/dL (ref 8–27)
Bilirubin Total: 0.4 mg/dL (ref 0.0–1.2)
CO2: 20 mmol/L (ref 20–29)
Calcium: 9.6 mg/dL (ref 8.6–10.2)
Chloride: 94 mmol/L — ABNORMAL LOW (ref 96–106)
Creatinine, Ser: 1.12 mg/dL (ref 0.76–1.27)
Globulin, Total: 2.5 g/dL (ref 1.5–4.5)
Glucose: 168 mg/dL — ABNORMAL HIGH (ref 70–99)
Potassium: 3.8 mmol/L (ref 3.5–5.2)
Sodium: 136 mmol/L (ref 134–144)
Total Protein: 7.4 g/dL (ref 6.0–8.5)
eGFR: 73 mL/min/{1.73_m2} (ref >59)

## 2023-12-10 LAB — LIPID PANEL
Chol/HDL Ratio: 5.4 ratio — ABNORMAL HIGH (ref 0.0–5.0)
Cholesterol, Total: 225 mg/dL — ABNORMAL HIGH (ref 100–199)
HDL: 42 mg/dL (ref >39)
LDL Chol Calc (NIH): 123 mg/dL — ABNORMAL HIGH (ref 0–99)
Triglycerides: 338 mg/dL — ABNORMAL HIGH (ref 0–149)
VLDL Cholesterol Cal: 60 mg/dL — ABNORMAL HIGH (ref 5–40)

## 2023-12-11 ENCOUNTER — Ambulatory Visit: Payer: Self-pay | Admitting: Nurse Practitioner

## 2023-12-11 ENCOUNTER — Other Ambulatory Visit: Payer: Self-pay

## 2023-12-11 DIAGNOSIS — E782 Mixed hyperlipidemia: Secondary | ICD-10-CM

## 2023-12-11 LAB — MICROALBUMIN / CREATININE URINE RATIO
Creatinine, Urine: 84.2 mg/dL
Microalb/Creat Ratio: 4 mg/g{creat} (ref 0–29)
Microalbumin, Urine: 3.3 ug/mL

## 2023-12-11 MED ORDER — ROSUVASTATIN CALCIUM 20 MG PO TABS
20.0000 mg | ORAL_TABLET | Freq: Every day | ORAL | 3 refills | Status: DC
Start: 2023-12-11 — End: 2024-03-10
  Filled 2023-12-11: qty 90, 90d supply, fill #0

## 2023-12-15 ENCOUNTER — Encounter: Payer: Self-pay | Admitting: Nurse Practitioner

## 2023-12-16 ENCOUNTER — Other Ambulatory Visit: Payer: Self-pay

## 2024-01-05 ENCOUNTER — Encounter: Payer: Self-pay | Admitting: Internal Medicine

## 2024-01-27 ENCOUNTER — Other Ambulatory Visit: Payer: Self-pay

## 2024-02-12 ENCOUNTER — Ambulatory Visit (AMBULATORY_SURGERY_CENTER)

## 2024-02-12 ENCOUNTER — Other Ambulatory Visit: Payer: Self-pay

## 2024-02-12 VITALS — Ht 69.0 in | Wt 189.0 lb

## 2024-02-12 DIAGNOSIS — Z1211 Encounter for screening for malignant neoplasm of colon: Secondary | ICD-10-CM

## 2024-02-12 MED ORDER — BISACODYL EC 5 MG PO TBEC
5.0000 mg | DELAYED_RELEASE_TABLET | ORAL | 0 refills | Status: DC
Start: 2024-02-12 — End: 2024-03-10
  Filled 2024-02-12: qty 4, 1d supply, fill #0

## 2024-02-12 MED ORDER — PEG 3350-KCL-NA BICARB-NACL 420 G PO SOLR
4000.0000 mL | Freq: Once | ORAL | 0 refills | Status: AC
Start: 2024-02-12 — End: 2024-02-14
  Filled 2024-02-12: qty 4000, 1d supply, fill #0

## 2024-02-12 NOTE — Progress Notes (Signed)
 No egg or soy allergy known to patient  No issues known to pt with past sedation with any surgeries or procedures Patient denies ever being told they had issues or difficulty with intubation  No FH of Malignant Hyperthermia Pt is not on diet pills Pt is not on  home 02  Pt is not on blood thinners  constipation recently  No A fib or A flutter Have any cardiac testing pending-- no  LOA: independent Prep: Golytely  Patient's chart reviewed by Norleen Schillings CNRA prior to previsit and patient appropriate for the [facility].  Previsit completed and red dot placed by patient's name on their procedure day (on provider's schedule).     PV completed with patient. Prep instructions sent via mychart and home address.

## 2024-02-13 ENCOUNTER — Other Ambulatory Visit: Payer: Self-pay

## 2024-02-18 ENCOUNTER — Telehealth: Payer: Self-pay | Admitting: Gastroenterology

## 2024-02-18 NOTE — Telephone Encounter (Signed)
 Returned call to patient and his wife.  They have not received instructions in the mail. Went over all prep instructions.  Patient voiced understanding.

## 2024-02-18 NOTE — Telephone Encounter (Signed)
 Patient wife is calling stating that she has a couple of question she would like to ask the nurse in regards to her husbands colonoscopy procedure. Patient wife is requesting a call back. Please advise.

## 2024-02-23 ENCOUNTER — Encounter: Payer: Self-pay | Admitting: Internal Medicine

## 2024-02-23 ENCOUNTER — Ambulatory Visit (AMBULATORY_SURGERY_CENTER): Admitting: Internal Medicine

## 2024-02-23 VITALS — BP 124/83 | HR 56 | Temp 98.2°F | Resp 16 | Ht 68.0 in | Wt 189.0 lb

## 2024-02-23 DIAGNOSIS — D124 Benign neoplasm of descending colon: Secondary | ICD-10-CM

## 2024-02-23 DIAGNOSIS — D125 Benign neoplasm of sigmoid colon: Secondary | ICD-10-CM | POA: Diagnosis not present

## 2024-02-23 DIAGNOSIS — I10 Essential (primary) hypertension: Secondary | ICD-10-CM | POA: Diagnosis not present

## 2024-02-23 DIAGNOSIS — Z1211 Encounter for screening for malignant neoplasm of colon: Secondary | ICD-10-CM

## 2024-02-23 MED ORDER — SODIUM CHLORIDE 0.9 % IV SOLN
500.0000 mL | Freq: Once | INTRAVENOUS | Status: DC
Start: 2024-02-23 — End: 2024-02-23
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
## 2024-02-23 NOTE — Progress Notes (Signed)
 Called to room to assist during endoscopic procedure.  Patient ID and intended procedure confirmed with present staff. Received instructions for my participation in the procedure from the performing physician.

## 2024-02-23 NOTE — Op Note (Signed)
 [facility] Patient Name: Larry Green Procedure Date: 02/23/2024 11:17 AM MRN: [id number] Endoscopist: Norleen SAILOR. Abran , MD, [id number] Age: 63 Referring MD:  Date of Birth: 1961/06/04 Gender: Male Account #: [id number] Procedure:                Colonoscopy with cold snare polypectomy x 2 Indications:              Screening for colorectal malignant neoplasm Medicines:                Monitored Anesthesia Care Procedure:                Pre-Anesthesia Assessment:                           - Prior to the procedure, a History and Physical                            was performed, and patient medications and                            allergies were reviewed. The patient's tolerance of                            previous anesthesia was also reviewed. The risks                            and benefits of the procedure and the sedation                            options and risks were discussed with the patient.                            All questions were answered, and informed consent                            was obtained. Prior Anticoagulants: The patient has                            taken no anticoagulant or antiplatelet agents. ASA                            Grade Assessment: II - A patient with mild systemic                            disease. After reviewing the risks and benefits,                            the patient was deemed in satisfactory condition to                            undergo the procedure.                           After obtaining informed consent, the colonoscope  was passed under direct vision. Throughout the                            procedure, the patient's blood pressure, pulse, and                            oxygen saturations were monitored continuously. The                            Olympus Scope SN: [id number] was introduced through                            the anus and advanced to the the cecum, identified                             by appendiceal orifice and ileocecal valve. The                            ileocecal valve, appendiceal orifice, and rectum                            were photographed. The quality of the bowel                            preparation was excellent. The colonoscopy was                            performed without difficulty. The patient tolerated                            the procedure well. The bowel preparation used was                            SUPREP via split dose instruction. Scope In: 11:26:23 AM Scope Out: 11:39:29 AM Scope Withdrawal Time: 0 hours 11 minutes 16 seconds  Total Procedure Duration: 0 hours 13 minutes 6 seconds  Findings:                 Two polyps were found in the sigmoid colon and                            descending colon. The polyps were 4 to 5 mm in                            size. These polyps were removed with a cold snare.                            Resection and retrieval were complete.                           The exam was otherwise without abnormality on                            direct and  retroflexion views. Complications:            No immediate complications. Estimated blood loss:                            None. Estimated Blood Loss:     Estimated blood loss: none. Impression:               - Two 4 to 5 mm polyps in the sigmoid colon and in                            the descending colon, removed with a cold snare.                            Resected and retrieved.                           - The examination was otherwise normal on direct                            and retroflexion views. Recommendation:           - Repeat colonoscopy in 7-10 years for surveillance.                           - Patient has a contact number available for                            emergencies. The signs and symptoms of potential                            delayed complications were discussed with the                            patient. Return to  normal activities tomorrow.                            Written discharge instructions were provided to the                            patient.                           - Resume previous diet.                           - Continue present medications.                           - Await pathology results. Norleen SAILOR. Abran, MD 02/23/2024 11:44:06 AM This report has been signed electronically.

## 2024-02-23 NOTE — Progress Notes (Signed)
 Pt's states no medical or surgical changes since previsit or office visit.

## 2024-02-23 NOTE — Progress Notes (Signed)
 A/o x 3, VSS, gd SR's, pleased with anesthesia, report to RN

## 2024-02-23 NOTE — Patient Instructions (Addendum)

## 2024-02-24 ENCOUNTER — Telehealth: Payer: Self-pay

## 2024-02-24 NOTE — Telephone Encounter (Signed)
  Follow up Call-     02/23/2024   10:46 AM  Call back number  Post procedure Call Back phone  # [phone]  Permission to leave phone message Yes     Patient questions:  Do you have a fever, pain , or abdominal swelling? No. Pain Score  0 *  Have you tolerated food without any problems? Yes.    Have you been able to return to your normal activities? Yes.    Do you have any questions about your discharge instructions: Diet   No. Medications  No. Follow up visit  No.  Do you have questions or concerns about your Care? No.  Actions: * If pain score is 4 or above: No action needed, pain <4.

## 2024-02-25 ENCOUNTER — Ambulatory Visit: Payer: Self-pay | Admitting: Internal Medicine

## 2024-02-25 LAB — SURGICAL PATHOLOGY

## 2024-03-09 ENCOUNTER — Telehealth: Payer: Self-pay | Admitting: Nurse Practitioner

## 2024-03-09 NOTE — Telephone Encounter (Signed)
 Pt confirm appt 8/19

## 2024-03-10 ENCOUNTER — Other Ambulatory Visit: Payer: Self-pay

## 2024-03-10 ENCOUNTER — Ambulatory Visit: Attending: Nurse Practitioner | Admitting: Nurse Practitioner

## 2024-03-10 ENCOUNTER — Encounter: Payer: Self-pay | Admitting: Nurse Practitioner

## 2024-03-10 VITALS — BP 138/76 | HR 71 | Resp 19 | Ht 68.0 in | Wt 186.6 lb

## 2024-03-10 DIAGNOSIS — E781 Pure hyperglyceridemia: Secondary | ICD-10-CM

## 2024-03-10 DIAGNOSIS — G8929 Other chronic pain: Secondary | ICD-10-CM | POA: Diagnosis not present

## 2024-03-10 DIAGNOSIS — M25561 Pain in right knee: Secondary | ICD-10-CM

## 2024-03-10 DIAGNOSIS — E119 Type 2 diabetes mellitus without complications: Secondary | ICD-10-CM | POA: Diagnosis not present

## 2024-03-10 DIAGNOSIS — I10 Essential (primary) hypertension: Secondary | ICD-10-CM | POA: Diagnosis not present

## 2024-03-10 MED ORDER — ROSUVASTATIN CALCIUM 20 MG PO TABS
20.0000 mg | ORAL_TABLET | Freq: Every day | ORAL | 3 refills | Status: AC
  Filled 2024-03-10: qty 90, 90d supply, fill #0
  Filled 2024-07-06: qty 90, 90d supply, fill #1

## 2024-03-10 MED ORDER — TRAMADOL HCL 50 MG PO TABS
50.0000 mg | ORAL_TABLET | Freq: Three times a day (TID) | ORAL | 0 refills | Status: AC | PRN
Start: 2024-03-10 — End: ?
  Filled 2024-03-10: qty 15, 5d supply, fill #0
  Filled 2024-04-12: qty 15, 5d supply, fill #1

## 2024-03-10 MED ORDER — HYDROCHLOROTHIAZIDE 25 MG PO TABS
25.0000 mg | ORAL_TABLET | Freq: Every day | ORAL | 1 refills | Status: AC
  Filled 2024-03-10: qty 30, 30d supply, fill #0
  Filled 2024-03-10: qty 90, 90d supply, fill #0
  Filled 2024-05-24: qty 30, 30d supply, fill #1
  Filled 2024-07-06: qty 30, 30d supply, fill #2
  Filled 2024-08-06: qty 30, 30d supply, fill #3
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
## 2024-03-10 NOTE — Progress Notes (Signed)
 Issues with memory.

## 2024-03-11 ENCOUNTER — Other Ambulatory Visit: Payer: Self-pay | Admitting: Nurse Practitioner

## 2024-03-11 ENCOUNTER — Ambulatory Visit: Payer: Self-pay | Admitting: Nurse Practitioner

## 2024-03-11 LAB — LIPID PANEL
Chol/HDL Ratio: 4.2 ratio (ref 0.0–5.0)
Cholesterol, Total: 195 mg/dL (ref 100–199)
HDL: 46 mg/dL (ref >39)
LDL Chol Calc (NIH): 97 mg/dL (ref 0–99)
Triglycerides: 308 mg/dL — ABNORMAL HIGH (ref 0–149)
VLDL Cholesterol Cal: 52 mg/dL — ABNORMAL HIGH (ref 5–40)

## 2024-03-11 LAB — HEMOGLOBIN A1C
Est. average glucose Bld gHb Est-mCnc: 163 mg/dL
Hgb A1c MFr Bld: 7.3 % — ABNORMAL HIGH (ref 4.8–5.6)

## 2024-03-11 MED ORDER — FENOFIBRATE 48 MG PO TABS
48.0000 mg | ORAL_TABLET | Freq: Every day | ORAL | 1 refills | Status: DC
  Filled 2024-03-11: qty 90, 90d supply, fill #0
  Filled 2024-05-24: qty 30, 30d supply, fill #1

## 2024-03-12 ENCOUNTER — Telehealth: Payer: Self-pay | Admitting: Nurse Practitioner

## 2024-03-12 ENCOUNTER — Other Ambulatory Visit: Payer: Self-pay | Admitting: Nurse Practitioner

## 2024-03-12 ENCOUNTER — Other Ambulatory Visit: Payer: Self-pay

## 2024-03-12 DIAGNOSIS — G8929 Other chronic pain: Secondary | ICD-10-CM
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
## 2024-03-15 ENCOUNTER — Other Ambulatory Visit: Payer: Self-pay

## 2024-03-15 ENCOUNTER — Ambulatory Visit
Admission: RE | Admit: 2024-03-15 | Discharge: 2024-03-15 | Disposition: A | Discharge status: Home / Self Care | Source: Ambulatory Visit | Attending: Nurse Practitioner

## 2024-03-15 DIAGNOSIS — G8929 Other chronic pain: Secondary | ICD-10-CM

## 2024-03-16 ENCOUNTER — Ambulatory Visit: Payer: Self-pay | Admitting: Nurse Practitioner

## 2024-04-12 ENCOUNTER — Other Ambulatory Visit: Payer: Self-pay

## 2024-05-05 ENCOUNTER — Other Ambulatory Visit: Payer: Self-pay

## 2024-05-05 ENCOUNTER — Other Ambulatory Visit: Payer: Self-pay | Admitting: Nurse Practitioner

## 2024-05-05 DIAGNOSIS — B351 Tinea unguium: Secondary | ICD-10-CM

## 2024-05-24 ENCOUNTER — Other Ambulatory Visit: Payer: Self-pay

## 2024-06-09 ENCOUNTER — Telehealth: Payer: Self-pay | Admitting: Nurse Practitioner

## 2024-06-09 NOTE — Telephone Encounter (Signed)
 pt confirmed appt (per vr

## 2024-06-11 ENCOUNTER — Other Ambulatory Visit: Payer: Self-pay

## 2024-06-11 ENCOUNTER — Ambulatory Visit: Attending: Nurse Practitioner | Admitting: Nurse Practitioner

## 2024-06-11 ENCOUNTER — Encounter: Payer: Self-pay | Admitting: Nurse Practitioner

## 2024-06-11 VITALS — BP 147/81 | HR 75 | Resp 19 | Ht 68.0 in | Wt 189.0 lb

## 2024-06-11 DIAGNOSIS — E119 Type 2 diabetes mellitus without complications: Secondary | ICD-10-CM | POA: Diagnosis present

## 2024-06-11 DIAGNOSIS — E781 Pure hyperglyceridemia: Secondary | ICD-10-CM | POA: Diagnosis not present

## 2024-06-11 DIAGNOSIS — Z23 Encounter for immunization: Secondary | ICD-10-CM

## 2024-06-11 DIAGNOSIS — Z7984 Long term (current) use of oral hypoglycemic drugs: Secondary | ICD-10-CM

## 2024-06-11 DIAGNOSIS — R350 Frequency of micturition: Secondary | ICD-10-CM | POA: Diagnosis not present

## 2024-06-11 DIAGNOSIS — L918 Other hypertrophic disorders of the skin: Secondary | ICD-10-CM | POA: Diagnosis not present

## 2024-06-11 DIAGNOSIS — I10 Essential (primary) hypertension: Secondary | ICD-10-CM | POA: Diagnosis not present

## 2024-06-11 DIAGNOSIS — B351 Tinea unguium: Secondary | ICD-10-CM

## 2024-06-11 LAB — POCT GLYCOSYLATED HEMOGLOBIN (HGB A1C): Hemoglobin A1C: 7.2 % — AB (ref 4.0–5.6)

## 2024-06-11 MED ORDER — METFORMIN HCL 500 MG PO TABS
500.0000 mg | ORAL_TABLET | Freq: Every day | ORAL | 3 refills | Status: AC
  Filled 2024-06-11: qty 60, 60d supply, fill #0
  Filled 2024-08-06: qty 60, 60d supply, fill #1

## 2024-06-11 MED ORDER — TERBINAFINE HCL 250 MG PO TABS
250.0000 mg | ORAL_TABLET | Freq: Every day | ORAL | 0 refills | Status: AC
  Filled 2024-06-11: qty 30, 30d supply, fill #0
  Filled 2024-07-06: qty 30, 30d supply, fill #1
  Filled 2024-08-06: qty 30, 30d supply, fill #2

## 2024-06-11 MED ORDER — FENOFIBRATE 48 MG PO TABS
48.0000 mg | ORAL_TABLET | Freq: Every day | ORAL | 1 refills | Status: AC
  Filled 2024-06-11 – 2024-07-06 (×2): qty 90, 90d supply, fill #0
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
## 2024-06-12 LAB — LIPID PANEL
Chol/HDL Ratio: 6 ratio — ABNORMAL HIGH (ref 0.0–5.0)
Cholesterol, Total: 298 mg/dL — ABNORMAL HIGH (ref 100–199)
HDL: 50 mg/dL (ref >39)
LDL Chol Calc (NIH): 197 mg/dL — ABNORMAL HIGH (ref 0–99)
Triglycerides: 260 mg/dL — ABNORMAL HIGH (ref 0–149)
VLDL Cholesterol Cal: 51 mg/dL — ABNORMAL HIGH (ref 5–40)

## 2024-06-12 LAB — CBC WITH DIFFERENTIAL/PLATELET
Basophils Absolute: 0 x10E3/uL (ref 0.0–0.2)
Basos: 1 %
EOS (ABSOLUTE): 0.1 x10E3/uL (ref 0.0–0.4)
Eos: 2 %
Hematocrit: 47.2 % (ref 37.5–51.0)
Hemoglobin: 16.1 g/dL (ref 13.0–17.7)
Immature Grans (Abs): 0 x10E3/uL (ref 0.0–0.1)
Immature Granulocytes: 0 %
Lymphocytes Absolute: 2.1 x10E3/uL (ref 0.7–3.1)
Lymphs: 39 %
MCH: 30.9 pg (ref 26.6–33.0)
MCHC: 34.1 g/dL (ref 31.5–35.7)
MCV: 91 fL (ref 79–97)
Monocytes Absolute: 0.8 x10E3/uL (ref 0.1–0.9)
Monocytes: 15 %
Neutrophils Absolute: 2.4 x10E3/uL (ref 1.4–7.0)
Neutrophils: 43 %
Platelets: 332 x10E3/uL (ref 150–450)
RBC: 5.21 x10E6/uL (ref 4.14–5.80)
RDW: 12.6 % (ref 11.6–15.4)
WBC: 5.4 x10E3/uL (ref 3.4–10.8)

## 2024-06-12 LAB — CMP14+EGFR
ALT: 59 IU/L — ABNORMAL HIGH (ref 0–44)
AST: 42 IU/L — ABNORMAL HIGH (ref 0–40)
Albumin: 4.7 g/dL (ref 3.9–4.9)
Alkaline Phosphatase: 76 IU/L (ref 47–123)
BUN/Creatinine Ratio: 9 — ABNORMAL LOW (ref 10–24)
BUN: 10 mg/dL (ref 8–27)
Bilirubin Total: 0.3 mg/dL (ref 0.0–1.2)
CO2: 27 mmol/L (ref 20–29)
Calcium: 9.8 mg/dL (ref 8.6–10.2)
Chloride: 95 mmol/L — ABNORMAL LOW (ref 96–106)
Creatinine, Ser: 1.13 mg/dL (ref 0.76–1.27)
Globulin, Total: 2.6 g/dL (ref 1.5–4.5)
Glucose: 166 mg/dL — ABNORMAL HIGH (ref 70–99)
Potassium: 3.9 mmol/L (ref 3.5–5.2)
Sodium: 137 mmol/L (ref 134–144)
Total Protein: 7.3 g/dL (ref 6.0–8.5)
eGFR: 72 mL/min/1.73 (ref >59)

## 2024-06-12 LAB — PSA: Prostate Specific Ag, Serum: 6.9 ng/mL — ABNORMAL HIGH (ref 0.0–4.0)

## 2024-06-20 ENCOUNTER — Ambulatory Visit: Payer: Self-pay | Admitting: Nurse Practitioner

## 2024-06-20 DIAGNOSIS — R972 Elevated prostate specific antigen [PSA]: Secondary | ICD-10-CM

## 2024-06-21 ENCOUNTER — Other Ambulatory Visit: Payer: Self-pay

## 2024-06-21 DIAGNOSIS — R350 Frequency of micturition: Secondary | ICD-10-CM

## 2024-06-21 DIAGNOSIS — R972 Elevated prostate specific antigen [PSA]: Secondary | ICD-10-CM

## 2024-06-22 ENCOUNTER — Ambulatory Visit: Attending: Family Medicine

## 2024-06-22 DIAGNOSIS — R350 Frequency of micturition: Secondary | ICD-10-CM

## 2024-06-22 DIAGNOSIS — R972 Elevated prostate specific antigen [PSA]: Secondary | ICD-10-CM

## 2024-06-23 LAB — URINALYSIS, COMPLETE
Bilirubin, UA: NEGATIVE
Ketones, UA: NEGATIVE
Leukocytes,UA: NEGATIVE
Nitrite, UA: NEGATIVE
Protein,UA: NEGATIVE
RBC, UA: NEGATIVE
Specific Gravity, UA: 1.02 (ref 1.005–1.030)
Urobilinogen, Ur: 0.2 mg/dL (ref 0.2–1.0)
pH, UA: 7 (ref 5.0–7.5)

## 2024-06-23 LAB — MICROSCOPIC EXAMINATION
Bacteria, UA: NONE SEEN
Casts: NONE SEEN /LPF
Epithelial Cells (non renal): NONE SEEN /HPF (ref 0–10)
RBC, Urine: NONE SEEN /HPF (ref 0–2)
WBC, UA: NONE SEEN /HPF (ref 0–5)

## 2024-06-23 LAB — PSA: Prostate Specific Ag, Serum: 7.7 ng/mL — ABNORMAL HIGH (ref 0.0–4.0)

## 2024-06-27 ENCOUNTER — Ambulatory Visit: Payer: Self-pay | Admitting: Nurse Practitioner

## 2024-07-06 ENCOUNTER — Other Ambulatory Visit: Payer: Self-pay

## 2024-07-20 ENCOUNTER — Ambulatory Visit: Attending: Nurse Practitioner

## 2024-07-20 ENCOUNTER — Other Ambulatory Visit: Payer: Self-pay

## 2024-07-20 DIAGNOSIS — Z23 Encounter for immunization: Secondary | ICD-10-CM

## 2024-07-20 NOTE — Progress Notes (Signed)
Flu vaccine administered per protocols.  Information sheet given. Patient denies and pain or discomfort at injection site. Tolerated injection well no reaction.  

## 2024-07-27 ENCOUNTER — Other Ambulatory Visit: Payer: Self-pay

## 2024-07-27 ENCOUNTER — Other Ambulatory Visit (HOSPITAL_COMMUNITY): Payer: Self-pay

## 2024-07-27 MED ORDER — LEVOFLOXACIN 750 MG PO TABS
750.0000 mg | ORAL_TABLET | ORAL | 0 refills | Status: AC
  Filled 2024-07-27 (×2): qty 1, 1d supply, fill #0

## 2024-08-06 ENCOUNTER — Other Ambulatory Visit: Payer: Self-pay

## 2024-09-17 ENCOUNTER — Ambulatory Visit: Admitting: Nurse Practitioner
